# Patient Record
Sex: Female | Born: 1985 | Race: Black or African American | Hispanic: No | Marital: Single | Smoking: Current every day smoker
Health system: Southern US, Community
[De-identification: ages and names within clinical notes are randomized; demographics above are authoritative.]

## PROBLEM LIST (undated history)

## (undated) DIAGNOSIS — I10 Essential (primary) hypertension: Secondary | ICD-10-CM

---
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
## 2004-10-27 ENCOUNTER — Emergency Department (HOSPITAL_COMMUNITY): Admission: EM | Admit: 2004-10-27 | Discharge: 2004-10-27 | Payer: Self-pay | Admitting: Emergency Medicine

## 2008-05-23 ENCOUNTER — Emergency Department (HOSPITAL_COMMUNITY): Admission: EM | Admit: 2008-05-23 | Discharge: 2008-05-23 | Payer: Self-pay | Admitting: *Deleted

## 2008-08-13 ENCOUNTER — Emergency Department (HOSPITAL_COMMUNITY): Admission: EM | Admit: 2008-08-13 | Discharge: 2008-08-13 | Payer: Self-pay | Admitting: Emergency Medicine

## 2009-03-24 ENCOUNTER — Emergency Department (HOSPITAL_COMMUNITY): Admission: EM | Admit: 2009-03-24 | Discharge: 2009-03-24 | Payer: Self-pay | Admitting: Emergency Medicine

## 2009-07-10 DIAGNOSIS — R87629 Unspecified abnormal cytological findings in specimens from vagina: Secondary | ICD-10-CM

## 2009-07-10 HISTORY — DX: Unspecified abnormal cytological findings in specimens from vagina: R87.629

## 2009-10-15 ENCOUNTER — Emergency Department (HOSPITAL_COMMUNITY): Admission: EM | Admit: 2009-10-15 | Discharge: 2009-10-15 | Payer: Self-pay | Admitting: Emergency Medicine

## 2010-09-28 LAB — URINALYSIS, ROUTINE W REFLEX MICROSCOPIC
Protein, ur: NEGATIVE mg/dL
Urobilinogen, UA: 1 mg/dL (ref 0.0–1.0)

## 2010-09-28 LAB — URINE MICROSCOPIC-ADD ON

## 2011-10-13 ENCOUNTER — Encounter (HOSPITAL_COMMUNITY): Payer: Self-pay

## 2011-10-13 ENCOUNTER — Emergency Department (HOSPITAL_COMMUNITY)
Admission: EM | Admit: 2011-10-13 | Discharge: 2011-10-13 | Disposition: A | Discharge status: Home / Self Care | Payer: No Typology Code available for payment source | Attending: Emergency Medicine | Admitting: Emergency Medicine

## 2011-10-13 ENCOUNTER — Emergency Department (HOSPITAL_COMMUNITY): Payer: No Typology Code available for payment source

## 2011-10-13 DIAGNOSIS — S139XXA Sprain of joints and ligaments of unspecified parts of neck, initial encounter: Secondary | ICD-10-CM | POA: Insufficient documentation

## 2011-10-13 DIAGNOSIS — M542 Cervicalgia: Secondary | ICD-10-CM | POA: Insufficient documentation

## 2011-10-13 DIAGNOSIS — Y92410 Unspecified street and highway as the place of occurrence of the external cause: Secondary | ICD-10-CM | POA: Insufficient documentation

## 2011-10-13 DIAGNOSIS — S161XXA Strain of muscle, fascia and tendon at neck level, initial encounter: Secondary | ICD-10-CM

## 2011-10-13 MED ORDER — OXYCODONE-ACETAMINOPHEN 5-325 MG PO TABS: 1.0000 | ORAL_TABLET | ORAL | Status: AC | PRN

## 2011-10-13 MED ORDER — IBUPROFEN 800 MG PO TABS
800.0000 mg | ORAL_TABLET | Freq: Once | ORAL | Status: AC
  Administered 2011-10-13: 800 mg via ORAL
  Filled 2011-10-13: qty 1

## 2011-10-13 MED ORDER — OXYCODONE-ACETAMINOPHEN 5-325 MG PO TABS
1.0000 | ORAL_TABLET | Freq: Once | ORAL | Status: AC
  Administered 2011-10-13: 1 via ORAL
  Filled 2011-10-13: qty 1
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
## 2011-10-13 NOTE — ED Notes (Signed)
Patient transported to X-ray 
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
## 2014-12-30 ENCOUNTER — Emergency Department (HOSPITAL_COMMUNITY): Payer: Self-pay

## 2014-12-30 ENCOUNTER — Emergency Department (HOSPITAL_COMMUNITY)
Admission: EM | Admit: 2014-12-30 | Discharge: 2014-12-30 | Disposition: A | Discharge status: Home / Self Care | Payer: Self-pay | Attending: Emergency Medicine | Admitting: Emergency Medicine

## 2014-12-30 ENCOUNTER — Encounter (HOSPITAL_COMMUNITY): Payer: Self-pay | Admitting: Emergency Medicine

## 2014-12-30 DIAGNOSIS — R202 Paresthesia of skin: Secondary | ICD-10-CM

## 2014-12-30 DIAGNOSIS — R42 Dizziness and giddiness: Secondary | ICD-10-CM | POA: Insufficient documentation

## 2014-12-30 DIAGNOSIS — R200 Anesthesia of skin: Secondary | ICD-10-CM

## 2014-12-30 DIAGNOSIS — G43909 Migraine, unspecified, not intractable, without status migrainosus: Secondary | ICD-10-CM | POA: Insufficient documentation

## 2014-12-30 DIAGNOSIS — I10 Essential (primary) hypertension: Secondary | ICD-10-CM

## 2014-12-30 HISTORY — DX: Essential (primary) hypertension: I10

## 2014-12-30 LAB — DIFFERENTIAL
BASOS ABS: 0.1 10*3/uL (ref 0.0–0.1)
BASOS PCT: 0 % (ref 0–1)
Eosinophils Absolute: 0.1 10*3/uL (ref 0.0–0.7)
Eosinophils Relative: 1 % (ref 0–5)
Lymphocytes Relative: 44 % (ref 12–46)
Lymphs Abs: 5.3 10*3/uL — ABNORMAL HIGH (ref 0.7–4.0)
MONOS PCT: 8 % (ref 3–12)
Monocytes Absolute: 1 10*3/uL (ref 0.1–1.0)
NEUTROS ABS: 5.5 10*3/uL (ref 1.7–7.7)
NEUTROS PCT: 47 % (ref 43–77)

## 2014-12-30 LAB — PROTIME-INR
INR: 1.02 (ref 0.00–1.49)
PROTHROMBIN TIME: 13.6 s (ref 11.6–15.2)

## 2014-12-30 LAB — COMPREHENSIVE METABOLIC PANEL
ALK PHOS: 52 U/L (ref 38–126)
ALT: 29 U/L (ref 14–54)
ANION GAP: 7 (ref 5–15)
AST: 20 U/L (ref 15–41)
Albumin: 4 g/dL (ref 3.5–5.0)
BILIRUBIN TOTAL: 0.9 mg/dL (ref 0.3–1.2)
BUN: 14 mg/dL (ref 6–20)
CO2: 25 mmol/L (ref 22–32)
CREATININE: 0.72 mg/dL (ref 0.44–1.00)
Calcium: 9.1 mg/dL (ref 8.9–10.3)
Chloride: 103 mmol/L (ref 101–111)
Glucose, Bld: 95 mg/dL (ref 65–99)
POTASSIUM: 3.7 mmol/L (ref 3.5–5.1)
SODIUM: 135 mmol/L (ref 135–145)
Total Protein: 7.2 g/dL (ref 6.5–8.1)

## 2014-12-30 LAB — I-STAT CHEM 8, ED
BUN: 13 mg/dL (ref 6–20)
Calcium, Ion: 1.18 mmol/L (ref 1.12–1.23)
Chloride: 103 mmol/L (ref 101–111)
Creatinine, Ser: 0.8 mg/dL (ref 0.44–1.00)
Glucose, Bld: 90 mg/dL (ref 65–99)
HEMATOCRIT: 41 % (ref 36.0–46.0)
HEMOGLOBIN: 13.9 g/dL (ref 12.0–15.0)
POTASSIUM: 3.7 mmol/L (ref 3.5–5.1)
SODIUM: 136 mmol/L (ref 135–145)
TCO2: 22 mmol/L (ref 0–100)

## 2014-12-30 LAB — CBC
HEMATOCRIT: 38.9 % (ref 36.0–46.0)
HEMOGLOBIN: 13.2 g/dL (ref 12.0–15.0)
MCH: 29.8 pg (ref 26.0–34.0)
MCHC: 33.9 g/dL (ref 30.0–36.0)
MCV: 87.8 fL (ref 78.0–100.0)
Platelets: 267 10*3/uL (ref 150–400)
RBC: 4.43 MIL/uL (ref 3.87–5.11)
RDW: 14 % (ref 11.5–15.5)
WBC: 11.9 10*3/uL — ABNORMAL HIGH (ref 4.0–10.5)

## 2014-12-30 LAB — I-STAT TROPONIN, ED: Troponin i, poc: 0 ng/mL (ref 0.00–0.08)

## 2014-12-30 LAB — APTT: APTT: 33 s (ref 24–37)

## 2014-12-30 LAB — CBG MONITORING, ED: GLUCOSE-CAPILLARY: 91 mg/dL (ref 65–99)

## 2014-12-30 MED ORDER — LISINOPRIL 10 MG PO TABS: 10.0000 mg | ORAL_TABLET | Freq: Every day | ORAL | Status: DC

## 2014-12-30 NOTE — ED Notes (Signed)
Patient transported to CT 

## 2014-12-30 NOTE — ED Notes (Signed)
Social worker at bedside.

## 2014-12-30 NOTE — ED Provider Notes (Addendum)
CSN: [id number]     Arrival date & time 12/30/14  1936 History   First MD Initiated Contact with Patient 12/30/14 2020     Chief Complaint  Patient presents with  . Numbness  . Hypertension     (Consider location/radiation/quality/duration/timing/severity/associated sxs/prior Treatment) HPI  29 year old female presents after having a more severe than typical migraine headache earlier in the day. When she was in between flights she noticed that she had a gradually worsening right-sided migraine headache. Location and type of pain is similar to multiple prior headaches that she gets almost daily but was a little more severe today. Also was associated with dizziness. She chronically has intermittent left fingertip numbness and left calf to foot numbness. He seemed to come and go. These were not particularly worse than normal. She has also chronically been hypertensive, frequently her sister who is a nurse checks and she is 160 systolic over 80. This is been ongoing for months and she's been trying diet and exercise to help with the symptoms. Has not been on medicines at any point. Denies chest pain, shortness of breath, or blurry vision. Headache, dizziness, and numbness of all resolved prior to me seeing the patient.  Past Medical History  Diagnosis Date  . Hypertension    History reviewed. No pertinent past surgical history. History reviewed. No pertinent family history. History  Substance Use Topics  . Smoking status: Not on file  . Smokeless tobacco: Not on file  . Alcohol Use: Not on file   OB History    No data available     Review of Systems  Constitutional: Negative for fever.  Eyes: Negative for visual disturbance.  Respiratory: Negative for shortness of breath.   Cardiovascular: Negative for chest pain.  Gastrointestinal: Negative for vomiting.  Neurological: Positive for dizziness, numbness and headaches. Negative for weakness.  All other systems reviewed and are  negative.     Allergies  Review of patient's allergies indicates no known allergies.  Home Medications   Prior to Admission medications   Medication Sig Start Date End Date Taking? Authorizing Provider  acetaminophen (TYLENOL) 325 MG tablet Take 650 mg by mouth every 6 (six) hours as needed for moderate pain (back pain). For pain   Yes Historical Provider, MD  ASA-APAP-Salicyl-Caff (PAIN RELIEF) TABS Take 1 tablet by mouth every 6 (six) hours as needed (back pain).   Yes Historical Provider, MD  lisinopril (PRINIVIL,ZESTRIL) 10 MG tablet Take 1 tablet (10 mg total) by mouth daily. 12/30/14   Pricilla Loveless, MD   BP 157/88 mmHg  Pulse 62  Temp(Src) 98.3 F (36.8 C) (Oral)  Resp 21  SpO2 100%  LMP 12/09/2014 Physical Exam  Constitutional: She is oriented to person, place, and time. She appears well-developed and well-nourished.  HENT:  Head: Normocephalic and atraumatic.  Right Ear: External ear normal.  Left Ear: External ear normal.  Nose: Nose normal.  Eyes: EOM are normal. Pupils are equal, round, and reactive to light. Right eye exhibits no discharge. Left eye exhibits no discharge.  Neck: Neck supple.  Cardiovascular: Normal rate, regular rhythm and normal heart sounds.   Pulmonary/Chest: Effort normal and breath sounds normal.  Abdominal: Soft. She exhibits no distension. There is no tenderness.  Neurological: She is alert and oriented to person, place, and time.  CN 2-12 grossly intact. 5/5 strength in all 4 extremities. Normal gross sensation to LLE, Left fingers has subjective decreased sensation. Normal finger to nose  Skin: Skin is warm and dry.  Nursing note and vitals reviewed.   ED Course  Procedures (including critical care time) Labs Review Labs Reviewed  CBC - Abnormal; Notable for the following:    WBC 11.9 (*)    All other components within normal limits  DIFFERENTIAL - Abnormal; Notable for the following:    Lymphs Abs 5.3 (*)    All other  components within normal limits  PROTIME-INR  APTT  COMPREHENSIVE METABOLIC PANEL  I-STAT CHEM 8, ED  I-STAT TROPOININ, ED  CBG MONITORING, ED    Imaging Review Ct Head (brain) Wo Contrast  12/30/2014   CLINICAL DATA:  High blood pressure , frontal headache  EXAM: CT HEAD WITHOUT CONTRAST  TECHNIQUE: Contiguous axial images were obtained from the base of the skull through the vertex without intravenous contrast.  COMPARISON:  None.  FINDINGS: No skull fracture is noted.  No intracranial hemorrhage, mass effect or midline shift.  No acute cortical infarction. No mass lesion is noted on this unenhanced scan. No hydrocephalus. The gray and white-matter differentiation is preserved.  Paranasal sinuses and mastoid air cells are unremarkable.  IMPRESSION: No acute intracranial abnormality.   Electronically Signed   By: Natasha Mead M.D.   On: 12/30/2014 20:41     EKG Interpretation   Date/Time:  Wednesday December 30 2014 19:47:23 EDT Ventricular Rate:  60 PR Interval:  128 QRS Duration: 101 QT Interval:  407 QTC Calculation: 407 R Axis:   20 Text Interpretation:  Sinus rhythm Baseline wander in lead(s) V1 V3 Normal  ECG No old tracing to compare Confirmed by Dandrea Medders  MD, Jaquin Coy ([id number]) on  12/30/2014 8:24:53 PM      MDM   Final diagnoses:  Essential hypertension  Numbness and tingling in left hand  Numbness in left leg    Patient's numbness is not an acute issue. Her migraine headache is already resolved, could be migraines or could be related to her chronic hypertension. At this point she is hypertensive but has no other symptoms except vague numbness to her fingertips. Given her long-standing history of hypertension, I will start her on lisinopril as she is already bradycardic in the ER. I counseled her on needing to stop this if she was to become pregnant. Discussed the importance of following up with her PCP. No signs of a stroke at this time.    Pricilla Loveless, MD 12/30/14  2125  Pricilla Loveless, MD 12/30/14 2130

## 2014-12-30 NOTE — Progress Notes (Signed)
EDCM spoke to patient at bedside. Patient confirms she does not have a pcp or insurance living in [place].  EDCM provided patient with pamphlet to [facility], informed patient of services there and walk in times.  EDCM also provided patient with list of pcps who accept self pay patients, list of discount pharmacies and websites needymeds.org and GoodRX.com for medication assistance, phone number to inquire about the orange card, phone number to inquire about Mediciad, phone number to inquire about the Affordable Care Act, financial resources in the community such as local churches, salvation army, urban ministries, and dental assistance for uninsured patients.  Patient thankful for resources.  No further EDCM needs at this time. 

## 2014-12-30 NOTE — ED Notes (Signed)
Pt states she just got in town from a flight this afternoon. States she started feeling dizzy around 12:30 this afternoon. When she got on her flight this afternoon she began feeling some left arm and leg numbness and tingling. States she also began feeling a severe migraine, which she has often. When she got to her younger sister's house after the flight, she took her blood pressure and found it to be 180/86. Hx of high blood pressure, states she does not take medication for it.

## 2014-12-30 NOTE — ED Notes (Signed)
MD at bedside. 

## 2014-12-30 NOTE — Discharge Instructions (Signed)
°Emergency Department Resource Guide °1) Find a Doctor and Pay Out of Pocket °Although you won't have to find out who is covered by your insurance plan, it is a good idea to ask around and get recommendations. You will then need to call the office and see if the doctor you have chosen will accept you as a new patient and what types of options they offer for patients who are self-pay. Some doctors offer discounts or will set up payment plans for their patients who do not have insurance, but you will need to ask so you aren't surprised when you get to your appointment. ° °2) Contact Your Local Health Department °Not all health departments have doctors that can see patients for sick visits, but many do, so it is worth a call to see if yours does. If you don't know where your local health department is, you can check in your phone book. The CDC also has a tool to help you locate your state's health department, and many state websites also have listings of all of their local health departments. ° °3) Find a Walk-in Clinic °If your illness is not likely to be very severe or complicated, you may want to try a walk in clinic. These are popping up all over the country in pharmacies, drugstores, and shopping centers. They're usually staffed by nurse practitioners or physician assistants that have been trained to treat common illnesses and complaints. They're usually fairly quick and inexpensive. However, if you have serious medical issues or chronic medical problems, these are probably not your best option. ° °No Primary Care Doctor: °- Call [facility] at  832-8000 - they can help you locate a primary care doctor that  accepts your insurance, provides certain services, etc. °- Physician Referral Service- 1-800-533-3463 ° °Chronic Pain Problems: °Organization         Address  Phone   Notes  °[facility]  (336) 297-2271 Patients need to be referred by their primary care doctor.  ° °Medication  Assistance: °Organization         Address  Phone   Notes  °[place] [facility] [address] °[place], [place] [address] (336) 641-8030 --Must be a resident of [place] °-- Must have NO insurance coverage whatsoever (no Medicaid/ Medicare, etc.) °-- The pt. MUST have a primary care doctor that directs their care regularly and follows them in the community °  °[facility]  (866) 331-1348   °[employer]  (888) 892-1162   ° °Agencies that provide inexpensive medical care: °Organization         Address  Phone   Notes  °[facility]  (336) 832-8035   °[facility]    (336) 832-7272   °[facility] [address] °[place], [place] [address] (336) 832-4777   °[facility] [address], °[place] (336) 271-4999   °[facility]    (336) 373-0678   °[facility]    (336) 272-1050   °[facility] ° [address], [place] Phone:  (336) 832-4444, Fax:  (336) 832-4440 Hours of Operation:  9 am - 6 pm, M-F.  Also accepts Medicaid/Medicare and self-pay.  °[facility] ° [address], [place] Phone: (336) 832-3150, Fax: (336) [phone]. Hours of Operation:  8:30 am - 5:30 pm, M-F.  Also accepts Medicaid and self-pay.  °[facility] [place] [address], [place] Phone: (336) 878-6027   °[facility] [address], [place], [place] (336)723-1848, Ext. 123 Mondays & Thursdays: 7-9 AM.  First 15 patients are seen on a first come, first serve basis. °  ° °Medicaid-accepting [place] Providers: ° °Organization         Address  Phone   Notes  °[facility] [address], [place] (336) 641-2100 Also accepts self-pay patients.  °[facility] [address], [place] ° (336) 856-9996   °[facility] [address], [place]  (336) 288-8857   °[facility] [address] [place] [address], [place] (336) 299-7000   °Veita Bland [address], [place]  ° (336) 373-1557 Only accepts [place] Access Medicaid patients after they have their name applied to their card.  ° °Self-Pay (no insurance) in [place]: ° °Organization         Address  Phone   Notes  °Sickle Cell Patients, [facility] [address], [place] (336) 832-1970   °[facility] [address], [place] (336) 832-4400   °[facility] ° [address], [place] (336) 992-4800   °[facility]/Dr. Osei-Bonsu ° [address] [place] [address], [place] or [address], [place] (336) [phone] Phone number for both [place] and [place] locations is the same.  °[facility] [address], [place] (336) 299-0000   °[facility] [address] [place] [address], [place] or [address] (336) 852-7530 °(336) 878-2260   °[facility] [address], [place] (336) 350-1642, phone; (336) 294-5005, fax Sees patients 1st and 3rd Saturday of every month.  Must not qualify for public or private insurance (i.e. Medicaid, Medicare, [place] Health Choice, Veterans' Benefits) • Household income should be no more than 200% of the poverty level •The clinic cannot treat you if you are pregnant or think you are pregnant • Sexually transmitted diseases are not treated at the clinic.  ° ° °Dental Care: °Organization         Address  Phone  Notes  °[place] [facility] [address], [place] (336) 641-6152 Accepts children up to age 21 who are enrolled in Medicaid or [place] Health Choice; pregnant women with a Medicaid card; and children who have applied for Medicaid or [place] Health Choice, but were declined, whose parents can pay a reduced fee at time of service.  °[place]  [facility] [place]  [address], [place] (336) 641-7733 Accepts children up to age 21 who are enrolled in Medicaid or [place] Health Choice; pregnant women with a Medicaid card; and children who have applied for Medicaid or [place] Health Choice, but were declined, whose parents can pay a reduced fee at time of service.  °[facility] ° [address],  (336) 641-4533 Patients are seen by appointment only. Walk-ins are not accepted. [facility] will see patients 18 years of age and older. °Monday - Tuesday (8am-5pm) °Most Wednesdays (8:30-5pm) °$30 per visit, cash only  °[facility] ° [address], [place] (336) 641-4533 Patients are seen by appointment only. Walk-ins are not accepted. [facility] will see patients 18 years of age and older. °One   Wednesday Evening (Monthly: Volunteer Based).  $30 per visit, cash only  °[facility]  (919) 537-3737 for adults; Children under age 4, call Graduate Pediatric Dentistry at (919) [phone]. Children aged 4-14, please call (919) [phone] to request a pediatric application. ° Dental services are provided in all areas of dental care including fillings, crowns and bridges, complete and partial dentures, implants, gum treatment, root canals, and extractions. Preventive care is also provided. Treatment is provided to both adults and children. °Patients are selected via a lottery and there is often a waiting list. °  °[facility] [address], °[place] ° (336) 763-8833 [email or web address] °  °[facility] [address], [place], [place] (336)723-1848, Ext. 123 Second and Fourth Thursday of each month, opens at 6:30 AM; Clinic ends at 9 AM.  Patients are seen on a first-come first-served basis, and a limited number are seen during each clinic.  ° °[facility] ° [address], [place], [place] (336) 723-7904    Eligibility Requirements °You must have lived in [place], [place], or [place] counties for at least the last three months. °  You cannot be eligible for state or federal sponsored healthcare insurance, including Veterans Administration, Medicaid, or Medicare. °  You generally cannot be eligible for healthcare insurance through your employer.  °  How to apply: °Eligibility screenings are held every Tuesday and Wednesday afternoon from 1:00 pm until 4:00 pm. You do not need an appointment for the interview!  °[facility] [address], [place], [place] 336-631-2330   °[place] [facility]  336-342-8273   °[facility]  336-703-3100   °[facility]  336-570-6415   ° °Behavioral Health Resources in the Community: °Intensive Outpatient Programs °Organization         Address  Phone  Notes  °[facility] [address], [place], [place] 336-878-6098   °[facility] [address], [place], [place] 336-832-9800   °[facility] [address], [place], [place] ° 336-882-2125   °[facility] [address],  °[place], [place] 1-800-853-5163 or 336-641-4981   °Substance Abuse Resources °Organization         Address  Phone  Notes  °[facility]  336-882-2125   °[facility]  336-784-9470   °[facility]  336-285-9073   °[facility]  336-845-3988   °Residential & Outpatient Substance Abuse Program  1-800-659-3381   °Psychological Services °Organization         Address  Phone  Notes  °[facility]  336- 832-9600   °[facility]  336- 378-7881   °[facility] [address], [place] 1-800-853-5163 or 336-641-4981   ° °Mobile Crisis Teams °Organization         Address  Phone  Notes  °[facility], Mobile Crisis Care Unit  1-877-626-1772   °Assertive °[facility] ° [address]  [place], [place] 336-834-9664   °Sharon DeEsch [address] °[place] 336-554-5454   ° °Self-Help/Support Groups °Organization         Address  Phone             Notes  °[facility] - variety of support groups  336- 373-1402 Call for more information  °Narcotics Anonymous (NA), [facility] [address], °[place]  2 meetings at this location  ° °  Residential Treatment Programs °Organization         Address  Phone  Notes  °[facility] [address],    °[place]  1-866-801-8205   °[facility] ° [address], [place], [place] 704-293-8524   °[facility] [address], [place] 336-845-3988 Admissions: 8am-3pm M-F  °[facility] [address]    °[place], [place] 336-841-1104   °[facility] [address], [place], [place] 336-379-7146   °[facility] [address]  ° [place], [place] 336-285-9073   °[facility] Intensive Outpatient [address], [place], [place] 336-852-3033   °[facility] [address]  °[place], [place] 1-877-615-2722 or 336-784-9470   °[facility] [address] [place], [place] 336-227-7417 Accepts Medicaid  °[facility] [address]  ° [place] 1-800-659-3381 Substance Abuse/Addiction Treatment  ° °[place] Behavioral Health Resources °Organization         Address  Phone  Notes  °[facility]  (888) 581-9988   °Julie Brannon, PhD [address] [place], [place]   (336) 349-5553 or (336) 951-0000   °[facility]   [address] °[place], [place] (336) 349-4454   °[facility] [address], [place], [place] (336) 342-8316 Insurance/Medicaid/sponsorship through [facility]  °[facility] [address]                                    [place], [place] (336) 342-8316 Therapy/tele-psych/case    °[facility] [address]  ° [place], [place] (336) 349-2233    °Dr. Arfeen  (336) 349-4544   °[facility] [place]  [employer] [place] [facility] 1) [address], [place] °2) [address], [place] °3)  [address], [place] (336) 349-3220 °(336) 342-7768 ° °(336) 342-8140   °[place] [facility] (336) 342-1394 or (336) 342-3537 (After Hours)    ° ° °

## 2014-12-30 NOTE — ED Notes (Addendum)
Pt reports hx of elevated BP for the last year.  Pt also states she has been getting headaches "every other night" x 74yr.  Pt also c/o left hand and left leg tingling and numbness intermittently x 73yr.  Pt states her symptoms today are not new.  Pt states she does not take medication for hypertension.

## 2018-07-10 HISTORY — PX: MOUTH SURGERY: SHX715

## 2021-01-11 ENCOUNTER — Ambulatory Visit (INDEPENDENT_AMBULATORY_CARE_PROVIDER_SITE_OTHER): Payer: Self-pay | Admitting: Family Medicine

## 2021-01-11 ENCOUNTER — Other Ambulatory Visit: Payer: Self-pay

## 2021-01-11 ENCOUNTER — Encounter: Payer: Self-pay | Admitting: Family Medicine

## 2021-01-11 VITALS — BP 140/76 | HR 70 | Wt 231.1 lb

## 2021-01-11 DIAGNOSIS — O10919 Unspecified pre-existing hypertension complicating pregnancy, unspecified trimester: Secondary | ICD-10-CM

## 2021-01-11 DIAGNOSIS — O0990 Supervision of high risk pregnancy, unspecified, unspecified trimester: Secondary | ICD-10-CM

## 2021-01-11 DIAGNOSIS — Z3A11 11 weeks gestation of pregnancy: Secondary | ICD-10-CM

## 2021-01-11 MED ORDER — NIFEDIPINE ER OSMOTIC RELEASE 30 MG PO TB24: 30.0000 mg | ORAL_TABLET | Freq: Every day | ORAL | 5 refills | Status: DC

## 2021-01-11 NOTE — Progress Notes (Signed)
   Subjective:  Suzanne Lambert is a 35 y.o. G2P0010 at [redacted]w[redacted]d being seen today for ongoing prenatal care.  She is currently monitored for the following issues for this high-risk pregnancy and has Supervision of high risk pregnancy, antepartum and Chronic hypertension affecting pregnancy on their problem list.  Patient reports  vaginal bleeding that was heavy last night, light today. Reports period was very regular prior to conceiving .   . Vag. Bleeding: Bloody Show.  Movement: Absent. Denies leaking of fluid.   The following portions of the patient's history were reviewed and updated as appropriate: allergies, current medications, past family history, past medical history, past social history, past surgical history and problem list. Problem list updated.  Objective:   Vitals:   01/11/21 1044  BP: 140/76  Pulse: 70  Weight: 231 lb 1.6 oz (104.8 kg)    Fetal Status:     Movement: Absent     General:  Alert, oriented and cooperative. Patient is in no acute distress.  Skin: Skin is warm and dry. No rash noted.   Cardiovascular: Normal heart rate noted  Respiratory: Normal respiratory effort, no problems with respiration noted  Abdomen: Soft, gravid, appropriate for gestational age. Pain/Pressure: Present     Pelvic: Vag. Bleeding: Bloody Show     Cervical exam deferred        Extremities: Normal range of motion.     Mental Status: Normal mood and affect. Normal behavior. Normal judgment and thought content.   Urinalysis:      Assessment and Plan:  Pregnancy: G2P0010 at [redacted]w[redacted]d  1. Supervision of high risk pregnancy, antepartum BP mildly elevated, see below Bedside US performed and showed viable IUP with normal somatic and cardiac activity FHR 171 by M-mode Discussed that vaginal bleeding in the first trimester is common, and 80-90% that have an IUP with normal cardiac activity go on to have a normal pregnancy Accidentally labeled as ROB instead of new OB, will reschedule for intake  and new OB appt  2. Chronic hypertension affecting pregnancy Mildly elevated, discussed CHAP trial and rationale for tighter control of BP Currently on labetalol 100mg  BID but endorses sometimes missing doses Transitioned to Nifedipine - NIFEdipine (PROCARDIA-XL/NIFEDICAL-XL) 30 MG 24 hr tablet; Take 1 tablet (30 mg total) by mouth daily.  Dispense: 30 tablet; Refill: 5  Preterm labor symptoms and general obstetric precautions including but not limited to vaginal bleeding, contractions, leaking of fluid and fetal movement were reviewed in detail with the patient. Please refer to After Visit Summary for other counseling recommendations.  Return in about 1 week (around 01/18/2021).   03/21/2021, MD

## 2021-01-17 ENCOUNTER — Other Ambulatory Visit: Payer: Self-pay

## 2021-01-17 ENCOUNTER — Telehealth (INDEPENDENT_AMBULATORY_CARE_PROVIDER_SITE_OTHER): Payer: Self-pay | Admitting: *Deleted

## 2021-01-17 VITALS — Ht 68.0 in

## 2021-01-17 DIAGNOSIS — Z3A00 Weeks of gestation of pregnancy not specified: Secondary | ICD-10-CM

## 2021-01-17 DIAGNOSIS — O10919 Unspecified pre-existing hypertension complicating pregnancy, unspecified trimester: Secondary | ICD-10-CM

## 2021-01-17 DIAGNOSIS — E669 Obesity, unspecified: Secondary | ICD-10-CM

## 2021-01-17 DIAGNOSIS — O09529 Supervision of elderly multigravida, unspecified trimester: Secondary | ICD-10-CM

## 2021-01-17 DIAGNOSIS — O0990 Supervision of high risk pregnancy, unspecified, unspecified trimester: Secondary | ICD-10-CM

## 2021-01-17 DIAGNOSIS — O99210 Obesity complicating pregnancy, unspecified trimester: Secondary | ICD-10-CM

## 2021-01-17 MED ORDER — PRENATAL 27-0.8 MG PO TABS: 1.0000 | ORAL_TABLET | Freq: Every day | ORAL | 12 refills | Status: DC

## 2021-01-17 NOTE — Progress Notes (Signed)
New OB Intake  I connected with  Suzanne Lambert on 01/17/21 at  9:15 AM EDT by MyChart Video Visit and verified that I am speaking with the correct person using two identifiers. Nurse is located at [facility] and pt is located at home.  I discussed the limitations, risks, security and privacy concerns of performing an evaluation and management service by telephone and the availability of in person appointments. I also discussed with the patient that there may be a patient responsible charge related to this service. The patient expressed understanding and agreed to proceed.  I explained I am completing New OB Intake today. We discussed her EDD of 07/30/21 that is based on LMP of 10/23/20. Pt is G2/P0010. I reviewed her allergies, medications, Medical/Surgical/OB history, and appropriate screenings. I informed her of [facility] services. Based on history, this is a/an  pregnancy complicated by [facility], AMA, Obesity  .   Patient Active Problem List   Diagnosis Date Noted   Supervision of high risk pregnancy, antepartum 01/11/2021   Chronic hypertension affecting pregnancy 01/11/2021    Concerns addressed today  Delivery Plans:  Plans to deliver at [facility].   MyChart/Babyscripts MyChart access verified. I explained pt will have some visits in office and some virtually. Babyscripts instructions given and order placed. Patient verifies receipt of registration text/e-mail.   Blood Pressure Cuff  Patient is applying for pregnancy Medicaid. Explained we can order bp cuff once medicaid effective.  Explained after first prenatal appt pt will check weekly and document in Babyscripts.  Weight scale: Patient does  not  have weight scale. Weight scale can be ordered once medicaid effective.   Anatomy US Explained first scheduled [place] will be around 19 weeks. Anatomy US will be scheduled and patient notified by Mychart.   Labs Discussed Avelina Laine genetic screening with patient. Would like both Panorama and Horizon  drawn at new OB visit. Routine prenatal labs needed.  Covid Vaccine Patient has not Had the covid vaccine.   Mother/ Baby Dyad Candidate?   No.  If yes, offer as possibility  Informed patient of [facility] and placed .phrase  In her AVS .  Social Determinants of Health Food Insecurity: Patient expresses food insecurity. Food Market information given to patient; explained patient may visit at the end of first OB appointment. WIC Referral: Patient is not interested in referral to [facility]. She already has [facility] appointment this week.  Transportation: Patient expressed transportation needs. Transportation Services reviewed with patient; patient registered and phone number provided for patient to schedule rides. Childcare: Discussed no children allowed at ultrasound appointments. Offered childcare services; patient declines childcare services at this time.  First visit review I reviewed new OB appt with pt. I explained she will have a pelvic exam, ob bloodwork with genetic screening, and PAP smear. Explained pt will be seen by Luna Kitchens ,CNM  at first visit; and then by MD's at later appointments due to being high risk. encounter routed to appropriate provider. Explained that patient will be seen by pregnancy navigator following visit with provider. [facility] information placed in AVS.   Jammi Morrissette,RN 01/17/2021  9:24 AM

## 2021-01-17 NOTE — Patient Instructions (Addendum)
  At our [facility] Practices, we work as an integrated team, providing care to address both physical and emotional health. Your medical provider may refer you to see our Behavioral Health Clinician [facility]) on the same day you see your medical provider, as availability permits; often scheduled virtually at your convenience.  Our [facility] is available to all patients, visits generally last between 20-30 minutes, but can be longer or shorter, depending on patient need. The [facility] offers help with stress management, coping with symptoms of depression and anxiety, major life changes , sleep issues, changing risky behavior, grief and loss, life stress, working on personal life goals, and  behavioral health issues, as these all affect your overall health and wellness.  The [facility] is NOT available for the following: FMLA paperwork, court-ordered evaluations, specialty assessments (custody or disability), letters to employers, or obtaining certification for an emotional support animal. The [facility] does not provide long-term therapy. You have the right to refuse integrated behavioral health services, or to reschedule to see the [facility] at a later date.  Confidentiality exception: If it is suspected that a child or disabled adult is being abused or neglected, we are required by law to report that to either Child Protective Services or Adult Management consultant.  If you have a diagnosis of Bipolar affective disorder, Schizophrenia, or recurrent Major depressive disorder, we will recommend that you establish care with a psychiatrist, as these are lifelong, chronic conditions, and we want your overall emotional health and medications to be more closely monitored. If you anticipate needing extended maternity leave due to mental health issues postpartum, it it recommended you inform your medical provider, so we can put in a referral to a psychiatrist as soon as possible. The [facility] is unable to recommend an extended maternity leave for mental  health issues. Your medical provider or [facility] may refer you to a therapist for ongoing, traditional therapy, or to a psychiatrist, for medication management, if it would benefit your overall health. Depending on your insurance, you may have a copay or be charged a deductible, depending on your insurance, to see the [facility]. If you are uninsured, it is recommended that you apply for financial assistance. (Forms may be requested at the front desk for in-person visits, via MyChart, or request a form during a virtual visit).  If you see the [facility] more than 6 times, you will have to complete a comprehensive clinical assessment interview with the [facility] to resume integrated services.  For virtual visits with the [facility], you must be physically in the state of [place] at the time of the visit. For example, if you live in [place], you will have to do an in-person visit with the [facility], and your out-of-state insurance may not cover behavioral health services in [place]. If you are going out of the state or country for any reason, the [facility] may see you virtually when you return to [place], but not while you are physically outside of [place].    [email or web address] is a great resource   If you are in need of transportation to get to and from your appointments in our office.  You can reach Transportation Services by calling [phone] Monday - Friday  7am-6pm.

## 2021-01-17 NOTE — Addendum Note (Signed)
Addended by: Gerome Apley on: 01/17/2021 03:29 PM   Modules accepted: Orders

## 2021-01-18 ENCOUNTER — Other Ambulatory Visit (HOSPITAL_COMMUNITY)
Admission: RE | Admit: 2021-01-18 | Discharge: 2021-01-18 | Disposition: A | Discharge status: Home / Self Care | Payer: Medicaid Other | Source: Ambulatory Visit | Attending: Student | Admitting: Student

## 2021-01-18 ENCOUNTER — Ambulatory Visit (INDEPENDENT_AMBULATORY_CARE_PROVIDER_SITE_OTHER): Payer: Self-pay | Admitting: Student

## 2021-01-18 VITALS — BP 150/90 | HR 80 | Wt 233.1 lb

## 2021-01-18 DIAGNOSIS — Z3A12 12 weeks gestation of pregnancy: Secondary | ICD-10-CM

## 2021-01-18 DIAGNOSIS — O0990 Supervision of high risk pregnancy, unspecified, unspecified trimester: Secondary | ICD-10-CM

## 2021-01-18 DIAGNOSIS — O10919 Unspecified pre-existing hypertension complicating pregnancy, unspecified trimester: Secondary | ICD-10-CM

## 2021-01-18 MED ORDER — NIFEDIPINE ER OSMOTIC RELEASE 60 MG PO TB24: 60.0000 mg | ORAL_TABLET | Freq: Every day | ORAL | 1 refills | Status: DC

## 2021-01-18 NOTE — Progress Notes (Signed)
Patient complains of "period like cramps" on the left side of pelvic along with vaginal spotting last night. Patient stated that she hasn't had any spotting this morning

## 2021-01-18 NOTE — Progress Notes (Addendum)
Subjective:    Suzanne Lambert is being seen today for her first obstetrical visit.  She had a prenatal visit with Dr. Crissie Reese last week, but this is her initial prenatal visit. This  not planned but not prevented pregnancy. She is at [redacted]w[redacted]d gestation. Her obstetrical history is significant for  CHTN, on procardia 30 mg XLand also taking baby ASA. She also takes PNV . Last IC 8 weeks ago; reports some BRB bleeding and then it turned brown.  It has resolved. She is worried about fetal well-being today. Relationship with FOB: significant other, not living together. He lives in [place] because he is a Surveyor, minerals. Patient does intend to breast feed. Pregnancy history fully reviewed.  She states that in the past her BP was 170/? States that it was "it was definitely higher" than it is today (today it is 150/90). . Reports that it was extremely high at the [facility] and was started on latebaltol by an MD there  in June 7.  She then came to her appt on 01/11/2021 and Dr. Crissie Reese switched her to procardia 30 XL.   She takes her procardia at 74 every day; has not taken today.   Patient reports no complaints.  Review of Systems:   Review of Systems  Constitutional: Negative.   HENT: Negative.    Genitourinary:  Positive for vaginal discharge.  Neurological: Negative.   Hematological: Negative.    Objective:     BP (!) 150/90   Pulse 80   Wt 233 lb 1.6 oz (105.7 kg)   LMP 10/23/2020   BMI 35.44 kg/m  Physical Exam Constitutional:      Appearance: Normal appearance.  Cardiovascular:     Rate and Rhythm: Normal rate and regular rhythm.  Pulmonary:     Effort: Pulmonary effort is normal.  Abdominal:     General: Abdomen is flat.     Palpations: Abdomen is soft.  Neurological:     Mental Status: She is alert.   Breast exam benign-no lumps, masses or tenderness.  Exam    Assessment:    Pregnancy: G2P0010 Patient Active Problem List   Diagnosis Date Noted   Obesity in pregnancy 01/17/2021    Supervision of high risk pregnancy, antepartum 01/11/2021   Chronic hypertension affecting pregnancy 01/11/2021       Plan:     Initial labs drawn. Prenatal vitamins. Problem list reviewed and updated. AFP3 discussed: too early. Role of ultrasound in pregnancy discussed; fetal survey: ordered. Amniocentesis discussed: not indicated. Follow up in 4 weeks. 75% of 30  min visit spent on counseling and coordination of care.  -genetic testing today -Pap today; she has temporary medicaid that covers her through the end of JUly  -discussed BP today with Dr. Shawnie Pons, will switch to 60 mg XL of procardia; RX sent -patient to see MD at next visit for HROB -continue taking baby ASA -will draw baseline labs today for [facility] -reviewed that Vag bleeding can be normal in pregnancy at the beginning; if she has pain or bleeding worsening she should go to MAU.  -Pt informed that the ultrasound is considered a limited OB ultrasound and is not intended to be a complete ultrasound exam.  Patient also informed that the ultrasound is not being completed with the intent of assessing for fetal or placental anomalies or any pelvic abnormalities.  Explained that the purpose of today's ultrasound is to assess for  viability.  Patient acknowledges the purpose of the exam and the limitations of  the study.  Cardiac activity and somatic movement noted.   Charlesetta Garibaldi [facility] 01/18/2021

## 2021-01-19 LAB — CBC/D/PLT+RPR+RH+ABO+RUBIGG...
Antibody Screen: NEGATIVE
Basophils Absolute: 0.1 10*3/uL (ref 0.0–0.2)
Basos: 1 %
EOS (ABSOLUTE): 0 10*3/uL (ref 0.0–0.4)
Eos: 0 %
HCV Ab: <0.1 s/co ratio (ref 0.0–0.9)
HIV Screen 4th Generation wRfx: NONREACTIVE
Hematocrit: 34.2 % (ref 34.0–46.6)
Hemoglobin: 10.5 g/dL — ABNORMAL LOW (ref 11.1–15.9)
Hepatitis B Surface Ag: NEGATIVE
Immature Grans (Abs): 0 10*3/uL (ref 0.0–0.1)
Immature Granulocytes: 0 %
Lymphocytes Absolute: 2.9 10*3/uL (ref 0.7–3.1)
Lymphs: 30 %
MCH: 22 pg — ABNORMAL LOW (ref 26.6–33.0)
MCHC: 30.7 g/dL — ABNORMAL LOW (ref 31.5–35.7)
MCV: 72 fL — ABNORMAL LOW (ref 79–97)
Monocytes Absolute: 1.1 10*3/uL — ABNORMAL HIGH (ref 0.1–0.9)
Monocytes: 11 %
Neutrophils Absolute: 5.5 10*3/uL (ref 1.4–7.0)
Neutrophils: 58 %
Platelets: 423 10*3/uL (ref 150–450)
RBC: 4.77 x10E6/uL (ref 3.77–5.28)
RDW: 28 % — ABNORMAL HIGH (ref 11.7–15.4)
RPR Ser Ql: NONREACTIVE
Rh Factor: POSITIVE
Rubella Antibodies, IGG: 3.99 index (ref >0.99)
WBC: 9.6 10*3/uL (ref 3.4–10.8)

## 2021-01-19 LAB — COMPREHENSIVE METABOLIC PANEL
ALT: 17 IU/L (ref 0–32)
AST: 11 IU/L (ref 0–40)
Albumin/Globulin Ratio: 1.8 (ref 1.2–2.2)
Albumin: 4.5 g/dL (ref 3.8–4.8)
Alkaline Phosphatase: 52 IU/L (ref 44–121)
BUN/Creatinine Ratio: 10 (ref 9–23)
BUN: 5 mg/dL — ABNORMAL LOW (ref 6–20)
Bilirubin Total: <0.2 mg/dL (ref 0.0–1.2)
CO2: 16 mmol/L — ABNORMAL LOW (ref 20–29)
Calcium: 10 mg/dL (ref 8.7–10.2)
Chloride: 103 mmol/L (ref 96–106)
Creatinine, Ser: 0.48 mg/dL — ABNORMAL LOW (ref 0.57–1.00)
Globulin, Total: 2.5 g/dL (ref 1.5–4.5)
Glucose: 97 mg/dL (ref 65–99)
Potassium: 4.4 mmol/L (ref 3.5–5.2)
Sodium: 134 mmol/L (ref 134–144)
Total Protein: 7 g/dL (ref 6.0–8.5)
eGFR: 126 mL/min/{1.73_m2} (ref >59)

## 2021-01-19 LAB — HEMOGLOBIN A1C
Est. average glucose Bld gHb Est-mCnc: 105 mg/dL
Hgb A1c MFr Bld: 5.3 % (ref 4.8–5.6)

## 2021-01-19 LAB — HCV INTERPRETATION

## 2021-01-19 LAB — PROTEIN / CREATININE RATIO, URINE
Creatinine, Urine: 211.6 mg/dL
Protein, Ur: 50.1 mg/dL
Protein/Creat Ratio: 237 mg/g creat — ABNORMAL HIGH (ref 0–200)

## 2021-01-20 ENCOUNTER — Other Ambulatory Visit: Payer: Self-pay | Admitting: Student

## 2021-01-20 DIAGNOSIS — B9629 Other Escherichia coli [E. coli] as the cause of diseases classified elsewhere: Secondary | ICD-10-CM | POA: Insufficient documentation

## 2021-01-20 DIAGNOSIS — N390 Urinary tract infection, site not specified: Secondary | ICD-10-CM | POA: Insufficient documentation

## 2021-01-20 LAB — URINE CULTURE, OB REFLEX

## 2021-01-20 LAB — CULTURE, OB URINE

## 2021-01-20 MED ORDER — NITROFURANTOIN MONOHYD MACRO 100 MG PO CAPS: 100.0000 mg | ORAL_CAPSULE | Freq: Two times a day (BID) | ORAL | 0 refills | Status: DC

## 2021-01-24 LAB — CYTOLOGY - PAP
Chlamydia: NEGATIVE
Comment: NEGATIVE
Comment: NEGATIVE
Comment: NORMAL
Diagnosis: NEGATIVE
High risk HPV: NEGATIVE
Neisseria Gonorrhea: NEGATIVE

## 2021-01-25 ENCOUNTER — Encounter: Payer: Self-pay | Admitting: *Deleted

## 2021-01-25 NOTE — Progress Notes (Signed)
Chart reviewed for nurse visit. Agree with plan of care.   Marylene Land, CNM 01/25/2021 5:18 PM

## 2021-01-26 ENCOUNTER — Encounter: Payer: Self-pay | Admitting: *Deleted

## 2021-01-28 ENCOUNTER — Encounter: Payer: Self-pay | Admitting: Student

## 2021-01-28 ENCOUNTER — Other Ambulatory Visit: Payer: Self-pay | Admitting: Student

## 2021-01-28 DIAGNOSIS — O99019 Anemia complicating pregnancy, unspecified trimester: Secondary | ICD-10-CM | POA: Insufficient documentation

## 2021-01-28 MED ORDER — FERROUS FUMARATE 325 (106 FE) MG PO TABS: 1.0000 | ORAL_TABLET | ORAL | 2 refills | Status: DC

## 2021-02-03 ENCOUNTER — Encounter: Payer: Self-pay | Admitting: General Practice

## 2021-02-10 ENCOUNTER — Encounter: Payer: Self-pay | Admitting: *Deleted

## 2021-02-16 ENCOUNTER — Encounter: Payer: Self-pay | Admitting: Obstetrics and Gynecology

## 2021-02-25 ENCOUNTER — Encounter: Payer: Self-pay | Admitting: Family Medicine

## 2021-02-25 ENCOUNTER — Ambulatory Visit (INDEPENDENT_AMBULATORY_CARE_PROVIDER_SITE_OTHER): Payer: Medicaid Other | Admitting: Family Medicine

## 2021-02-25 ENCOUNTER — Other Ambulatory Visit: Payer: Self-pay | Admitting: Family Medicine

## 2021-02-25 ENCOUNTER — Other Ambulatory Visit: Payer: Self-pay

## 2021-02-25 VITALS — BP 146/88 | HR 75 | Wt 238.7 lb

## 2021-02-25 DIAGNOSIS — D649 Anemia, unspecified: Secondary | ICD-10-CM

## 2021-02-25 DIAGNOSIS — O10912 Unspecified pre-existing hypertension complicating pregnancy, second trimester: Secondary | ICD-10-CM

## 2021-02-25 DIAGNOSIS — N390 Urinary tract infection, site not specified: Secondary | ICD-10-CM

## 2021-02-25 DIAGNOSIS — O0990 Supervision of high risk pregnancy, unspecified, unspecified trimester: Secondary | ICD-10-CM

## 2021-02-25 DIAGNOSIS — O99212 Obesity complicating pregnancy, second trimester: Secondary | ICD-10-CM

## 2021-02-25 DIAGNOSIS — Z3A17 17 weeks gestation of pregnancy: Secondary | ICD-10-CM

## 2021-02-25 DIAGNOSIS — O2342 Unspecified infection of urinary tract in pregnancy, second trimester: Secondary | ICD-10-CM

## 2021-02-25 DIAGNOSIS — O99210 Obesity complicating pregnancy, unspecified trimester: Secondary | ICD-10-CM

## 2021-02-25 DIAGNOSIS — O99012 Anemia complicating pregnancy, second trimester: Secondary | ICD-10-CM

## 2021-02-25 DIAGNOSIS — E669 Obesity, unspecified: Secondary | ICD-10-CM

## 2021-02-25 DIAGNOSIS — B9629 Other Escherichia coli [E. coli] as the cause of diseases classified elsewhere: Secondary | ICD-10-CM

## 2021-02-25 DIAGNOSIS — O10919 Unspecified pre-existing hypertension complicating pregnancy, unspecified trimester: Secondary | ICD-10-CM

## 2021-02-25 NOTE — Progress Notes (Signed)
Pt BP was elevated today R-148/93 & L-146/88. Pt stated that she has not taken her Procardia this morning yet. Asked Pt is she feeling movement yet & she claims that she has but it has been less, explained movement usually starts around 16 to 20 weeks,FHR was 155 & baby was active.

## 2021-02-25 NOTE — Progress Notes (Signed)
I connected with Suzanne Lambert 02/25/21 at  9:00 AM EDT by: MyChart video and verified that I am speaking with the correct person using two identifiers.  Patient is located at [employer] [facility] and provider is located at home.     The purpose of this virtual visit is to provide medical care while limiting exposure to the novel coronavirus. I discussed the limitations, risks, security and privacy concerns of performing an evaluation and management service by MyChart video and the availability of in person appointments. I also discussed with the patient that there may be a patient responsible charge related to this service. By engaging in this virtual visit, you consent to the provision of healthcare.  Additionally, you authorize for your insurance to be billed for the services provided during this visit.  The patient expressed understanding and agreed to proceed.  The following staff members participated in the virtual visit:  Venora Maples, MD/MPH Attending Family Medicine Physician, Faculty Practice Center for [facility], [facility]     PRENATAL VISIT NOTE  Subjective:  Suzanne Lambert is a 35 y.o. G2P0010 at [redacted]w[redacted]d  for phone visit for ongoing prenatal care.  She is currently monitored for the following issues for this high-risk pregnancy and has Supervision of high risk pregnancy, antepartum; Chronic hypertension affecting pregnancy; Obesity in pregnancy; UTI due to extended-spectrum beta lactamase (ESBL) producing Escherichia coli; and Anemia affecting pregnancy on their problem list.  Patient reports no complaints.  Contractions: Not present. Vag. Bleeding: None.  Movement: Present. Denies leaking of fluid.   The following portions of the patient's history were reviewed and updated as appropriate: allergies, current medications, past family history, past medical history, past social history, past surgical history and problem list.   Objective:   Vitals:    02/25/21 0918 02/25/21 0921  BP: (!) 148/93 (!) 146/88  Pulse: 83 75  Weight: 238 lb 11.2 oz (108.3 kg)    Self-Obtained  Fetal Status: Fetal Heart Rate (bpm): 155   Movement: Present     Assessment and Plan:  Pregnancy: G2P0010 at [redacted]w[redacted]d 1. Supervision of high risk pregnancy, antepartum Mildly elevated BP but has not had meds this morning FHR normal AFP today  2. Chronic hypertension affecting pregnancy Has not had meds today Defer dose increase  3. UTI due to extended-spectrum beta lactamase (ESBL) producing Escherichia coli TOC today  4. Obesity in pregnancy   5. Anemia affecting pregnancy in second trimester Taking PO iron every other day  Preterm labor symptoms and general obstetric precautions including but not limited to vaginal bleeding, contractions, leaking of fluid and fetal movement were reviewed in detail with the patient.  Return in 4 weeks (on 03/25/2021) for [facility], ob visit.  Future Appointments  Date Time Provider Department Center  03/07/2021  8:45 AM [facility] NURSE [facility]  03/07/2021  9:00 AM [facility] US1 [facility]     Time spent on virtual visit: 12 minutes  Venora Maples, MD

## 2021-02-25 NOTE — Patient Instructions (Signed)

## 2021-02-27 LAB — URINE CULTURE, OB REFLEX

## 2021-02-27 LAB — CULTURE, OB URINE

## 2021-03-07 ENCOUNTER — Encounter: Payer: Self-pay | Admitting: *Deleted

## 2021-03-07 ENCOUNTER — Other Ambulatory Visit: Payer: Self-pay | Admitting: Student

## 2021-03-07 ENCOUNTER — Ambulatory Visit: Payer: Medicaid Other | Attending: Student

## 2021-03-07 ENCOUNTER — Other Ambulatory Visit: Payer: Self-pay

## 2021-03-07 ENCOUNTER — Other Ambulatory Visit: Payer: Self-pay | Admitting: Family Medicine

## 2021-03-07 ENCOUNTER — Ambulatory Visit: Payer: Medicaid Other | Admitting: *Deleted

## 2021-03-07 ENCOUNTER — Ambulatory Visit (HOSPITAL_BASED_OUTPATIENT_CLINIC_OR_DEPARTMENT_OTHER): Payer: Medicaid Other | Admitting: Obstetrics

## 2021-03-07 ENCOUNTER — Other Ambulatory Visit: Payer: Self-pay | Admitting: *Deleted

## 2021-03-07 VITALS — BP 143/81 | HR 88

## 2021-03-07 DIAGNOSIS — O09522 Supervision of elderly multigravida, second trimester: Secondary | ICD-10-CM

## 2021-03-07 DIAGNOSIS — O10912 Unspecified pre-existing hypertension complicating pregnancy, second trimester: Secondary | ICD-10-CM

## 2021-03-07 DIAGNOSIS — O10919 Unspecified pre-existing hypertension complicating pregnancy, unspecified trimester: Secondary | ICD-10-CM | POA: Diagnosis present

## 2021-03-07 DIAGNOSIS — O3432 Maternal care for cervical incompetence, second trimester: Secondary | ICD-10-CM | POA: Diagnosis present

## 2021-03-07 DIAGNOSIS — O99210 Obesity complicating pregnancy, unspecified trimester: Secondary | ICD-10-CM | POA: Insufficient documentation

## 2021-03-07 DIAGNOSIS — O358XX1 Maternal care for other (suspected) fetal abnormality and damage, fetus 1: Secondary | ICD-10-CM

## 2021-03-07 DIAGNOSIS — O26872 Cervical shortening, second trimester: Secondary | ICD-10-CM | POA: Diagnosis present

## 2021-03-07 DIAGNOSIS — O0990 Supervision of high risk pregnancy, unspecified, unspecified trimester: Secondary | ICD-10-CM | POA: Insufficient documentation

## 2021-03-07 DIAGNOSIS — Z3A19 19 weeks gestation of pregnancy: Secondary | ICD-10-CM | POA: Insufficient documentation

## 2021-03-07 DIAGNOSIS — O35BXX1 Maternal care for other (suspected) fetal abnormality and damage, fetal cardiac anomalies, fetus 1: Secondary | ICD-10-CM

## 2021-03-07 DIAGNOSIS — O26879 Cervical shortening, unspecified trimester: Secondary | ICD-10-CM

## 2021-03-07 MED ORDER — PROGESTERONE 200 MG PO CAPS: 200.0000 mg | ORAL_CAPSULE | Freq: Every day | ORAL | 11 refills | Status: DC

## 2021-03-07 NOTE — Progress Notes (Signed)
Vaginal progesterone for new diagnosis of cervical insufficiency

## 2021-03-07 NOTE — Progress Notes (Signed)
MFM Note  Suzanne Lambert was seen for a detailed fetal anatomy scan due to advanced maternal age and chronic hypertension treated with Procardia 60 mg daily.  Her pregnancy history includes two prior elective terminations of pregnancies.  She denies any other significant past medical history and denies any problems in her current pregnancy.    She had a cell free DNA test earlier in her pregnancy which indicated a low risk for trisomy 71, 40, and 13. A female fetus is predicted.   She was informed that the fetal growth and amniotic fluid level were appropriate for her gestational age.   On today's exam, an intracardiac echogenic focus was noted in the left ventricle of the fetal heart.  The small association between an echogenic focus and Down syndrome was discussed. Due to the echogenic focus noted today, the patient was offered and declined an amniocentesis today for definitive diagnosis of fetal aneuploidy.  She reports that she is comfortable with her negative cell free DNA test.  The patient was informed that anomalies may be missed due to technical limitations. If the fetus is in a suboptimal position or maternal habitus is increased, visualization of the fetus in the maternal uterus may be impaired.  The following were discussed during today's consultation:  Sonographically detected shortened cervix with funneling  During the performance of her abdominal scan, funneling of her cervix was suspected.  Due to this indication, a transvaginal ultrasound was performed today showing significant cervical funneling.  The gestational sac appears to be funneled into her cervix.  Her overall cervical length was 0.55 cm long.  Despite the funneling, her cervical tissue still appears to be thick on either side of the funnel.  The increased risk of a preterm birth due to the significant funneling of her cervix was discussed today.  The patient does not have a history of a prior preterm birth and denies any  prior surgeries to her cervix.  She denies any lower abdominal cramping or contractions.  Management options for a sonographically detected shortened cervix including daily vaginal progesterone and a cervical cerclage were discussed today.  The patient declined to have a cerclage placed and would prefer to use daily vaginal progesterone.  She was advised that vaginal progesterone has been shown to decrease the risk of a preterm birth in women who have a sonographically detected shortened cervix.    A prescription for vaginal progesterone was sent to the patient's pharmacy today.  Pelvic rest was advised.  We will continue to follow her closely with serial cervical length measurements.  The patient understands that should she deliver prior to 23 to 24 weeks, her baby would be considered nonviable.  Should she have a previable delivery, she should have a prophylactic cerclage placed at between 13 to 14 weeks of her future pregnancy.  To help improve her obstetrical outcome, consideration may be given to inpatient management (starting at 23 to 24 weeks) to enable her to reach a more optimal gestational age for delivery.  Chronic hypertension in pregnancy  The implications and management of chronic hypertension in pregnancy was discussed. The patient was advised that should her blood pressures continue to be elevated, the dosage of her antihypertensive medications may need to be increased.  The increased risk of superimposed preeclampsia, an indicated preterm delivery, and possible fetal growth restriction due to chronic hypertension in pregnancy was discussed.    Weekly fetal testing should be started at around 32 weeks (should she remain undelivered).   To  decrease her risk of superimposed preeclampsia, she should start taking a daily baby aspirin (81 mg daily) for preeclampsia prophylaxis.   Another cervical length measurement was scheduled in our office in 2 weeks.  The patient stated that all  of her questions have been answered to her complete satisfaction.  A total of 30 minutes was spent counseling and coordinating the care for this patient.  Greater than 50% of the time was spent in direct face-to-face contact.  Recommendations:  Daily vaginal progesterone Daily baby aspirin for preeclampsia prophylaxis Consider hospitalization at between 23 to 24 weeks Continue Procardia Repeat cervical length measurement in 2 weeks

## 2021-03-15 ENCOUNTER — Observation Stay (HOSPITAL_COMMUNITY)
Admission: AD | Admit: 2021-03-15 | Discharge: 2021-03-16 | Disposition: A | Discharge status: Home / Self Care | Payer: Medicaid Other | Attending: Obstetrics & Gynecology | Admitting: Obstetrics & Gynecology

## 2021-03-15 ENCOUNTER — Encounter (HOSPITAL_COMMUNITY): Payer: Self-pay | Admitting: Obstetrics and Gynecology

## 2021-03-15 ENCOUNTER — Other Ambulatory Visit: Payer: Self-pay

## 2021-03-15 ENCOUNTER — Inpatient Hospital Stay (HOSPITAL_BASED_OUTPATIENT_CLINIC_OR_DEPARTMENT_OTHER): Payer: Medicaid Other

## 2021-03-15 DIAGNOSIS — Z79899 Other long term (current) drug therapy: Secondary | ICD-10-CM | POA: Insufficient documentation

## 2021-03-15 DIAGNOSIS — O42913 Preterm premature rupture of membranes, unspecified as to length of time between rupture and onset of labor, third trimester: Secondary | ICD-10-CM | POA: Diagnosis not present

## 2021-03-15 DIAGNOSIS — Z3A30 30 weeks gestation of pregnancy: Secondary | ICD-10-CM

## 2021-03-15 DIAGNOSIS — Z3A20 20 weeks gestation of pregnancy: Secondary | ICD-10-CM | POA: Diagnosis not present

## 2021-03-15 DIAGNOSIS — O10012 Pre-existing essential hypertension complicating pregnancy, second trimester: Secondary | ICD-10-CM | POA: Diagnosis not present

## 2021-03-15 DIAGNOSIS — O42912 Preterm premature rupture of membranes, unspecified as to length of time between rupture and onset of labor, second trimester: Secondary | ICD-10-CM | POA: Diagnosis not present

## 2021-03-15 DIAGNOSIS — Z20822 Contact with and (suspected) exposure to covid-19: Secondary | ICD-10-CM | POA: Insufficient documentation

## 2021-03-15 DIAGNOSIS — Z0371 Encounter for suspected problem with amniotic cavity and membrane ruled out: Secondary | ICD-10-CM

## 2021-03-15 DIAGNOSIS — O99891 Other specified diseases and conditions complicating pregnancy: Secondary | ICD-10-CM

## 2021-03-15 DIAGNOSIS — R109 Unspecified abdominal pain: Secondary | ICD-10-CM

## 2021-03-15 DIAGNOSIS — O99210 Obesity complicating pregnancy, unspecified trimester: Secondary | ICD-10-CM

## 2021-03-15 DIAGNOSIS — O4290 Premature rupture of membranes, unspecified as to length of time between rupture and onset of labor, unspecified weeks of gestation: Secondary | ICD-10-CM | POA: Diagnosis present

## 2021-03-15 DIAGNOSIS — Z87891 Personal history of nicotine dependence: Secondary | ICD-10-CM | POA: Insufficient documentation

## 2021-03-15 DIAGNOSIS — Z8759 Personal history of other complications of pregnancy, childbirth and the puerperium: Secondary | ICD-10-CM

## 2021-03-15 LAB — COMPREHENSIVE METABOLIC PANEL
ALT: 23 U/L (ref 0–44)
AST: 21 U/L (ref 15–41)
Albumin: 3.2 g/dL — ABNORMAL LOW (ref 3.5–5.0)
Alkaline Phosphatase: 42 U/L (ref 38–126)
Anion gap: 9 (ref 5–15)
BUN: 6 mg/dL (ref 6–20)
CO2: 20 mmol/L — ABNORMAL LOW (ref 22–32)
Calcium: 9.5 mg/dL (ref 8.9–10.3)
Chloride: 106 mmol/L (ref 98–111)
Creatinine, Ser: 0.57 mg/dL (ref 0.44–1.00)
GFR, Estimated: >60 mL/min (ref >60)
Glucose, Bld: 84 mg/dL (ref 70–99)
Potassium: 3.4 mmol/L — ABNORMAL LOW (ref 3.5–5.1)
Sodium: 135 mmol/L (ref 135–145)
Total Bilirubin: 0.3 mg/dL (ref 0.3–1.2)
Total Protein: 6.6 g/dL (ref 6.5–8.1)

## 2021-03-15 LAB — TYPE AND SCREEN
ABO/RH(D): O POS
Antibody Screen: NEGATIVE

## 2021-03-15 LAB — CBC
HCT: 34.8 % — ABNORMAL LOW (ref 36.0–46.0)
Hemoglobin: 11.5 g/dL — ABNORMAL LOW (ref 12.0–15.0)
MCH: 26.9 pg (ref 26.0–34.0)
MCHC: 33 g/dL (ref 30.0–36.0)
MCV: 81.3 fL (ref 80.0–100.0)
Platelets: 359 10*3/uL (ref 150–400)
RBC: 4.28 MIL/uL (ref 3.87–5.11)
RDW: 20.6 % — ABNORMAL HIGH (ref 11.5–15.5)
WBC: 12.1 10*3/uL — ABNORMAL HIGH (ref 4.0–10.5)
nRBC: 0 % (ref 0.0–0.2)

## 2021-03-15 LAB — AMNISURE RUPTURE OF MEMBRANE (ROM) NOT AT ARMC: Amnisure ROM: NEGATIVE

## 2021-03-15 LAB — RESP PANEL BY RT-PCR (FLU A&B, COVID) ARPGX2
Influenza A by PCR: NEGATIVE
Influenza B by PCR: NEGATIVE
SARS Coronavirus 2 by RT PCR: NEGATIVE

## 2021-03-15 LAB — POCT FERN TEST: POCT Fern Test: NEGATIVE

## 2021-03-15 MED ORDER — DOCUSATE SODIUM 100 MG PO CAPS
100.0000 mg | ORAL_CAPSULE | Freq: Every day | ORAL | Status: DC
  Administered 2021-03-16: 100 mg via ORAL
  Filled 2021-03-15: qty 1

## 2021-03-15 MED ORDER — LABETALOL HCL 5 MG/ML IV SOLN: 80.0000 mg | INTRAVENOUS | Status: DC | PRN

## 2021-03-15 MED ORDER — ZOLPIDEM TARTRATE 5 MG PO TABS: 5.0000 mg | ORAL_TABLET | Freq: Every evening | ORAL | Status: DC | PRN

## 2021-03-15 MED ORDER — LABETALOL HCL 5 MG/ML IV SOLN: 40.0000 mg | INTRAVENOUS | Status: DC | PRN

## 2021-03-15 MED ORDER — LABETALOL HCL 5 MG/ML IV SOLN
20.0000 mg | INTRAVENOUS | Status: DC | PRN
  Administered 2021-03-15: 20 mg via INTRAVENOUS
  Filled 2021-03-15: qty 4

## 2021-03-15 MED ORDER — CALCIUM CARBONATE ANTACID 500 MG PO CHEW: 2.0000 | CHEWABLE_TABLET | ORAL | Status: DC | PRN

## 2021-03-15 MED ORDER — PRENATAL MULTIVITAMIN CH
1.0000 | ORAL_TABLET | Freq: Every day | ORAL | Status: DC
  Administered 2021-03-16: 1 via ORAL
  Filled 2021-03-15: qty 1

## 2021-03-15 MED ORDER — ACETAMINOPHEN 325 MG PO TABS: 650.0000 mg | ORAL_TABLET | ORAL | Status: DC | PRN

## 2021-03-15 MED ORDER — HYDRALAZINE HCL 20 MG/ML IJ SOLN
10.0000 mg | INTRAMUSCULAR | Status: DC | PRN
Start: 2021-03-15 — End: 2021-03-16

## 2021-03-15 NOTE — MAU Note (Addendum)
Pt states she woke up at 1610  and  felt a gush of clear fluid. Says she feels non-painful pressure or left side She denies bleeding. FHR 154 doppler. Pt has short cervix.

## 2021-03-15 NOTE — MAU Provider Note (Addendum)
History     CSN: [id number]  Arrival date and time: 03/15/21 1732   Event Date/Time   First Provider Initiated Contact with Patient 03/15/21 1823      Chief Complaint  Patient presents with   Rupture of Membranes   HPI Suzanne Lambert is a 35 y.o. G2P0010 at [redacted]w[redacted]d who presents to MAU with concern for rupture of membranes. Patient endorses feeling a gush of fluid today at about 1600 hours. Patient denies continuous leaking of fluid. She also denies abdominal pain, dysuria, fever or recent illness. She is remote from sexual intercourse.  OB History     Gravida  2   Para  0   Term  0   Preterm  0   AB  1   Living  0      SAB  0   IAB  1   Ectopic  0   Multiple  0   Live Births  0           Past Medical History:  Diagnosis Date   Hypertension    Vaginal Pap smear, abnormal 2011   [facility]    Past Surgical History:  Procedure Laterality Date   MOUTH SURGERY  2020    Family History  Problem Relation Age of Onset   Diabetes Mother    Hypertension Mother    Stroke Father    Hypertension Father    Heart disease Brother    Stroke Brother     Social History   Tobacco Use   Smoking status: Former    Packs/day: 0.50    Years: 15.00    Pack years: 7.50    Types: Cigarettes    Quit date: 06/08/2020    Years since quitting: 0.7   Smokeless tobacco: Never  Vaping Use   Vaping Use: Never used  Substance Use Topics   Alcohol use: Not Currently    Comment: social   Drug use: Not Currently    Types: Marijuana    Comment: last used 05/2020    Allergies: No Known Allergies  Medications Prior to Admission  Medication Sig Dispense Refill Last Dose   ferrous fumarate (HEMOCYTE - 106 MG FE) 325 (106 Fe) MG TABS tablet Take 1 tablet (106 mg of iron total) by mouth every other day. 30 tablet 2 03/14/2021   NIFEdipine (PROCARDIA XL) 60 MG 24 hr tablet Take 1 tablet (60 mg total) by mouth daily. 90 tablet 1 03/15/2021   Prenatal Vit-Fe Fumarate-FA  (MULTIVITAMIN-PRENATAL) 27-0.8 MG TABS tablet Take 1 tablet by mouth daily at 12 noon. 30 tablet 12 03/14/2021   progesterone (PROMETRIUM) 200 MG capsule Place 1 capsule (200 mg total) vaginally daily. 30 capsule 11 03/14/2021   Prenatal Vit-Fe Fumarate-FA (PRENATAL PO) Take by mouth.       Review of Systems  Genitourinary:  Positive for vaginal discharge.  Physical Exam   Blood pressure (!) 142/84, pulse 72, resp. rate 19, last menstrual period 10/23/2020, SpO2 100 %.  Physical Exam Vitals and nursing note reviewed. Exam conducted with a chaperone present.  Constitutional:      Appearance: Normal appearance.  Cardiovascular:     Rate and Rhythm: Normal rate.     Pulses: Normal pulses.     Heart sounds: Normal heart sounds.  Pulmonary:     Effort: Pulmonary effort is normal.     Breath sounds: Normal breath sounds.  Abdominal:     Comments: Gravid  Genitourinary:    Comments: Pelvic exam: External  genitalia normal, vaginal walls pink and well rugated, cervix visually closed, no lesions noted.    Skin:    Capillary Refill: Capillary refill takes less than 2 seconds.  Neurological:     Mental Status: She is alert and oriented to person, place, and time.  Psychiatric:        Mood and Affect: Mood normal.        Behavior: Behavior normal.        Thought Content: Thought content normal.        Judgment: Judgment normal.    MAU Course  Procedures --Funneling and shortened cervix noted on imaging from  --Patient actively leaking large volumes of fluid s/p bedside ultrasound --Revisited previous discussions regarding previable gestational age --No measurable cervix on tonight's MFM scan --Dr. Debroah Loop inbound to MAU to discuss options  Report given to Wynelle Bourgeois, CNM who assumes care of patient at this time  Clayton Bibles, MSN, CNM Certified Nurse Midwife, [facility] for [employer], [facility] 03/15/21 8:00 PM   Assessment and Plan   A:  Single IUP at [redacted]w[redacted]d      PPROM  P:  Seen by Dr Debroah Loop       Admit for observation      MD to follow

## 2021-03-15 NOTE — H&P (Signed)
History    CSN: [id number]   Arrival date and time: 03/15/21 1732    Event Date/Time   First Provider Initiated Contact with Patient 03/15/21 1823          Chief Complaint  Patient presents with   Rupture of Membranes    HPI Suzanne Lambert is a 35 y.o. G2P0010 at [redacted]w[redacted]d who presents to MAU with concern for rupture of membranes. Patient endorses feeling a gush of fluid today at about 1600 hours. Patient denies continuous leaking of fluid. She also denies abdominal pain, dysuria, fever or recent illness. She is remote from sexual intercourse.   OB History       Gravida  2   Para  0   Term  0   Preterm  0   AB  1   Living  0        SAB  0   IAB  1   Ectopic  0   Multiple  0   Live Births  0                   Past Medical History:  Diagnosis Date   Hypertension     Vaginal Pap smear, abnormal 2011    [facility]           Past Surgical History:  Procedure Laterality Date   MOUTH SURGERY   2020           Family History  Problem Relation Age of Onset   Diabetes Mother     Hypertension Mother     Stroke Father     Hypertension Father     Heart disease Brother     Stroke Brother        Social History         Tobacco Use   Smoking status: Former      Packs/day: 0.50      Years: 15.00      Pack years: 7.50      Types: Cigarettes      Quit date: 06/08/2020      Years since quitting: 0.7   Smokeless tobacco: Never  Vaping Use   Vaping Use: Never used  Substance Use Topics   Alcohol use: Not Currently      Comment: social   Drug use: Not Currently      Types: Marijuana      Comment: last used 05/2020      Allergies: No Known Allergies          Medications Prior to Admission  Medication Sig Dispense Refill Last Dose   ferrous fumarate (HEMOCYTE - 106 MG FE) 325 (106 Fe) MG TABS tablet Take 1 tablet (106 mg of iron total) by mouth every other day. 30 tablet 2 03/14/2021   NIFEdipine (PROCARDIA XL) 60 MG 24 hr tablet Take 1 tablet (60 mg  total) by mouth daily. 90 tablet 1 03/15/2021   Prenatal Vit-Fe Fumarate-FA (MULTIVITAMIN-PRENATAL) 27-0.8 MG TABS tablet Take 1 tablet by mouth daily at 12 noon. 30 tablet 12 03/14/2021   progesterone (PROMETRIUM) 200 MG capsule Place 1 capsule (200 mg total) vaginally daily. 30 capsule 11 03/14/2021   Prenatal Vit-Fe Fumarate-FA (PRENATAL PO) Take by mouth.            Review of Systems  Genitourinary:  Positive for vaginal discharge.  Physical Exam    Blood pressure (!) 142/84, pulse 72, resp. rate 19, last menstrual period 10/23/2020, SpO2 100 %.   Physical Exam  Vitals and nursing note reviewed. Exam conducted with a chaperone present.  Constitutional:      Appearance: Normal appearance.  Cardiovascular:     Rate and Rhythm: Normal rate.     Pulses: Normal pulses.     Heart sounds: Normal heart sounds.  Pulmonary:     Effort: Pulmonary effort is normal.     Breath sounds: Normal breath sounds.  Abdominal:     Comments: Gravid  Genitourinary:    Comments: Pelvic exam: External genitalia normal, vaginal walls pink and well rugated, cervix visually closed, no lesions noted.      Skin:    Capillary Refill: Capillary refill takes less than 2 seconds.  Neurological:     Mental Status: She is alert and oriented to person, place, and time.  Psychiatric:        Mood and Affect: Mood normal.        Behavior: Behavior normal.        Thought Content: Thought content normal.        Judgment: Judgment normal.      MAU Course  Procedures --Funneling and shortened cervix noted on imaging from  --Patient actively leaking large volumes of fluid s/p bedside ultrasound --Revisited previous discussions regarding previable gestational age --No measurable cervix on tonight's MFM scan --Dr. Debroah Loop inbound to MAU to discuss options   Report given to Wynelle Bourgeois, CNM who assumes care of patient at this time   Clayton Bibles, MSN, CNM Certified Nurse Midwife, [facility] for  [employer], [facility] 03/15/21 8:00 PM   Attestation of Attending Supervision of Advanced Practitioner (CNM/NP/PA): Evaluation and management procedures were performed by the Advanced Practitioner under my supervision and collaboration.  I have reviewed the Advanced Practitioner's note and chart, and I agree with the management and plan. After discussion with the patient I will observe overnight and consult MFM tomorrow due to PPROM with severe prematurity    Scheryl Darter MD 03/15/2021 8:26 PM

## 2021-03-16 ENCOUNTER — Telehealth: Payer: Self-pay

## 2021-03-16 ENCOUNTER — Observation Stay (HOSPITAL_BASED_OUTPATIENT_CLINIC_OR_DEPARTMENT_OTHER): Payer: Medicaid Other

## 2021-03-16 DIAGNOSIS — O42912 Preterm premature rupture of membranes, unspecified as to length of time between rupture and onset of labor, second trimester: Secondary | ICD-10-CM

## 2021-03-16 DIAGNOSIS — O99891 Other specified diseases and conditions complicating pregnancy: Secondary | ICD-10-CM | POA: Diagnosis not present

## 2021-03-16 DIAGNOSIS — Z3A20 20 weeks gestation of pregnancy: Secondary | ICD-10-CM | POA: Diagnosis not present

## 2021-03-16 DIAGNOSIS — O3432 Maternal care for cervical incompetence, second trimester: Secondary | ICD-10-CM | POA: Diagnosis not present

## 2021-03-16 DIAGNOSIS — R109 Unspecified abdominal pain: Secondary | ICD-10-CM | POA: Diagnosis not present

## 2021-03-16 LAB — CBC WITH DIFFERENTIAL/PLATELET
Abs Immature Granulocytes: 0.04 10*3/uL (ref 0.00–0.07)
Basophils Absolute: 0 10*3/uL (ref 0.0–0.1)
Basophils Relative: 0 %
Eosinophils Absolute: 0 10*3/uL (ref 0.0–0.5)
Eosinophils Relative: 0 %
HCT: 31.6 % — ABNORMAL LOW (ref 36.0–46.0)
Hemoglobin: 10.9 g/dL — ABNORMAL LOW (ref 12.0–15.0)
Immature Granulocytes: 0 %
Lymphocytes Relative: 22 %
Lymphs Abs: 2.6 10*3/uL (ref 0.7–4.0)
MCH: 27.7 pg (ref 26.0–34.0)
MCHC: 34.5 g/dL (ref 30.0–36.0)
MCV: 80.4 fL (ref 80.0–100.0)
Monocytes Absolute: 1.2 10*3/uL — ABNORMAL HIGH (ref 0.1–1.0)
Monocytes Relative: 11 %
Neutro Abs: 7.5 10*3/uL (ref 1.7–7.7)
Neutrophils Relative %: 67 %
Platelets: 354 10*3/uL (ref 150–400)
RBC: 3.93 MIL/uL (ref 3.87–5.11)
RDW: 20.3 % — ABNORMAL HIGH (ref 11.5–15.5)
WBC: 11.4 10*3/uL — ABNORMAL HIGH (ref 4.0–10.5)
nRBC: 0 % (ref 0.0–0.2)

## 2021-03-16 LAB — AFP, SERUM, OPEN SPINA BIFIDA
AFP MoM: 1.25
AFP Value: 46.9 ng/mL
Gest. Age on Collection Date: 17.9 weeks
Maternal Age At EDD: 36.7 yr
OSBR Risk 1 IN: 10000
Test Results:: NEGATIVE
Weight: 238 [lb_av]

## 2021-03-16 MED ORDER — NIFEDIPINE ER OSMOTIC RELEASE 60 MG PO TB24
60.0000 mg | ORAL_TABLET | Freq: Every day | ORAL | Status: DC
  Administered 2021-03-16: 60 mg via ORAL
  Filled 2021-03-16: qty 1

## 2021-03-16 MED ORDER — FERROUS FUMARATE 324 (106 FE) MG PO TABS
1.0000 | ORAL_TABLET | ORAL | Status: DC
  Administered 2021-03-16: 106 mg via ORAL
  Filled 2021-03-16 (×2): qty 1

## 2021-03-16 MED ORDER — ACETAMINOPHEN 325 MG PO TABS
650.0000 mg | ORAL_TABLET | ORAL | Status: DC | PRN
Start: 2021-03-16 — End: 2021-03-28

## 2021-03-16 MED ORDER — ASPIRIN 81 MG PO CHEW
81.0000 mg | CHEWABLE_TABLET | Freq: Every day | ORAL | Status: DC
  Administered 2021-03-16: 81 mg via ORAL
  Filled 2021-03-16: qty 1

## 2021-03-16 MED ORDER — ASPIRIN 81 MG PO CHEW: 81.0000 mg | CHEWABLE_TABLET | Freq: Every day | ORAL | 3 refills | Status: DC

## 2021-03-16 NOTE — Consult Note (Signed)
Neonatology Consult Note:  At the request of the patients obstetrician Dr. Roselie Awkward  I met with Suzanne Lambert, who is a 35 y.o. G2P0010 with IUP at [id number] for previable PPROM.  Pregnancy complicated by shortened CL for which she was on vaginal progesterone. Her current plan is for hospital discharge today for expectant management.  She is aware of the concerning signs and symptoms for when she would need to come back to the hospital. She plans to limit strenuous activity, but may do normal movement in her home.      We discussed the limits of viability and offered resuscitation at 23 weeks should her pregnancy progress to that stage.  We discussed the impact PPROM can have on limiting lung growth and development.  We discussed morbidity/mortality surrounding the periviable gestional ages, as well as delivery room resuscitation, including intubation and surfactant in DR.  Discussed mechanical ventilation and risk for chronic lung disease, risk for IVH with potential for motor / cognitive deficits.  We discussed NG / OG feeds, benefits of MBM.  She is hopeful to make it to 23 weeks (or longer) and requests all resuscitative efforts.  Thank you for allowing [facility] participate in her care.  Please call with questions.  BHiginio Roger DO  Neonatologist  The total length of face-to-face or floor / unit time for this encounter was 40 minutes.  Counseling and / or coordination of care was greater than fifty percent of the time.

## 2021-03-16 NOTE — Discharge Summary (Signed)
Antenatal Physician Discharge Summary  Patient ID: SHAMS FILL MRN: [id number] DOB/AGE: 01/09/1986 35 y.o.  Admit date: 03/15/2021 Discharge date: 03/16/2021  Admission Diagnoses: Previable PPROM CHTN  Discharge Diagnoses:  Same  Prenatal Procedures: ultrasound  Consults: Neonatology, Maternal Fetal Medicine  Hospital Course:  Suzanne Lambert is a 35 y.o. G2P0010 with IUP at [redacted]w[redacted]d admitted for previable PPROM.  She was admitted with LOF which was ultimately confirmed rupture. It was diagnosed in the s/o shortened CL for which she was doing vaginal progesterone. She had no signs or symptoms of infection. She had no bleeding. Her WBC count remained stable bt 11-12 with no evidence of bandemia/left shift. She was afebrile and VSS without tachycardia.   With her primary advocate, her sister [name], we went through all the options available related to previable PPROM. We discussed the risks of PPROM - loss, preterm labor, infection that is potentially life-threatening to the mother, abruption. We reviewed limits on neonatal outcomes primarily related to lung development in the s/o PPROM this early. NICU consult placed and she was seen by them during her stay. We discussed the ineffectiveness of antibiotics in this setting. We discussed the lack of available interventions at this time. We discussed her other option of delivery at this time. She would like expectant management.   We discussed the concerning signs and symptoms for when she would need to come back to the hospital. We discussed limiting strenuous activity, but that she may do normal movement in her home. We discussed the risk of strict bed rest of that of DVT.   We discussed a short term follow up plan of repeat US in one week, appt with MFM and with [place] along with weekly CBC. All questions answered to her and [name]'s satisfaction. I offered MFM consult if they would still like to proceed but they declined and felt satisfied with our  conversation.   Discharge Exam: Temp:  [97.6 F (36.4 C)-98.3 F (36.8 C)] 97.6 F (36.4 C) (09/07 1116) Pulse Rate:  [72-91] 86 (09/07 1116) Resp:  [16-19] 16 (09/07 1116) BP: (123-183)/(61-96) 123/66 (09/07 1116) SpO2:  [99 %-100 %] 100 % (09/07 1116) Weight:  [107.5 kg] 107.5 kg (09/06 2142)  Physical Examination: CONSTITUTIONAL: Well-developed, well-nourished female in no acute distress.  HENT:  Normocephalic, atraumatic, External right and left ear normal.  EYES: Conjunctivae and EOM are normal. Pupils are equal, round, and reactive to light. No scleral icterus.  NECK: Normal range of motion, supple, no masses SKIN: Skin is warm and dry. No rash noted. Not diaphoretic. No erythema. No pallor. NEUROLOGIC: Alert and oriented to person, place, and time. Normal reflexes, muscle tone coordination. No cranial nerve deficit noted. PSYCHIATRIC: Normal mood and affect. Normal behavior. Normal judgment and thought content. CARDIOVASCULAR: Normal heart rate noted, regular rhythm RESPIRATORY: Effort and breath sounds normal, no problems with respiration noted MUSCULOSKELETAL: Normal range of motion. No edema and no tenderness. 2+ distal pulses. ABDOMEN: Soft, nontender, nondistended, gravid. CERVIX: Visualized closed prior to confirmed ROM  Fetal monitoring: FHT: 140s   Significant Diagnostic Studies:  Results for orders placed or performed during the hospital encounter of 03/15/21 (from the past 168 hour(s))  Amnisure rupture of membrane (rom)not at [facility]   Collection Time: 03/15/21  6:08 PM  Result Value Ref Range   Amnisure ROM NEGATIVE   CBC   Collection Time: 03/15/21  6:08 PM  Result Value Ref Range   WBC 12.1 (H) 4.0 - 10.5 K/uL   RBC  4.28 3.87 - 5.11 MIL/uL   Hemoglobin 11.5 (L) 12.0 - 15.0 g/dL   HCT 16.1 (L) 09.6 - 04.5 %   MCV 81.3 80.0 - 100.0 fL   MCH 26.9 26.0 - 34.0 pg   MCHC 33.0 30.0 - 36.0 g/dL   RDW 40.9 (H) 81.1 - 91.4 %   Platelets 359 150 - 400 K/uL    nRBC 0.0 0.0 - 0.2 %  Comprehensive metabolic panel   Collection Time: 03/15/21  6:08 PM  Result Value Ref Range   Sodium 135 135 - 145 mmol/L   Potassium 3.4 (L) 3.5 - 5.1 mmol/L   Chloride 106 98 - 111 mmol/L   CO2 20 (L) 22 - 32 mmol/L   Glucose, Bld 84 70 - 99 mg/dL   BUN 6 6 - 20 mg/dL   Creatinine, Ser 7.82 0.44 - 1.00 mg/dL   Calcium 9.5 8.9 - 95.6 mg/dL   Total Protein 6.6 6.5 - 8.1 g/dL   Albumin 3.2 (L) 3.5 - 5.0 g/dL   AST 21 15 - 41 U/L   ALT 23 0 - 44 U/L   Alkaline Phosphatase 42 38 - 126 U/L   Total Bilirubin 0.3 0.3 - 1.2 mg/dL   GFR, Estimated >21 >30 mL/min   Anion gap 9 5 - 15  Type and screen [facility]   Collection Time: 03/15/21  6:08 PM  Result Value Ref Range   ABO/RH(D) O POS    Antibody Screen NEG    Sample Expiration      03/18/2021,2359 Performed at [facility], [address], [place], [place] [address]   Crist Fat Test   Collection Time: 03/15/21  9:19 PM  Result Value Ref Range   POCT Fern Test Negative = intact amniotic membranes   Resp Panel by RT-PCR (Flu A&B, Covid) Nasopharyngeal Swab   Collection Time: 03/15/21  9:54 PM   Specimen: Nasopharyngeal Swab; Nasopharyngeal(NP) swabs in vial transport medium  Result Value Ref Range   SARS Coronavirus 2 by RT PCR NEGATIVE NEGATIVE   Influenza A by PCR NEGATIVE NEGATIVE   Influenza B by PCR NEGATIVE NEGATIVE  CBC with Differential/Platelet   Collection Time: 03/16/21 12:00 PM  Result Value Ref Range   WBC 11.4 (H) 4.0 - 10.5 K/uL   RBC 3.93 3.87 - 5.11 MIL/uL   Hemoglobin 10.9 (L) 12.0 - 15.0 g/dL   HCT 46.9 (L) 62.9 - 52.8 %   MCV 80.4 80.0 - 100.0 fL   MCH 27.7 26.0 - 34.0 pg   MCHC 34.5 30.0 - 36.0 g/dL   RDW 41.3 (H) 24.4 - 01.0 %   Platelets 354 150 - 400 K/uL   nRBC 0.0 0.0 - 0.2 %   Neutrophils Relative % 67 %   Neutro Abs 7.5 1.7 - 7.7 K/uL   Lymphocytes Relative 22 %   Lymphs Abs 2.6 0.7 - 4.0 K/uL   Monocytes Relative 11 %   Monocytes Absolute 1.2 (H)  0.1 - 1.0 K/uL   Eosinophils Relative 0 %   Eosinophils Absolute 0.0 0.0 - 0.5 K/uL   Basophils Relative 0 %   Basophils Absolute 0.0 0.0 - 0.1 K/uL   Immature Granulocytes 0 %   Abs Immature Granulocytes 0.04 0.00 - 0.07 K/uL   [place] MFM OB Transvaginal  Result Date: 03/07/2021 ----------------------------------------------------------------------  OBSTETRICS REPORT                       (Signed  Final 03/07/2021 01:29 pm) ---------------------------------------------------------------------- Patient Info  ID #:       [id number]                          D.O.B.:  [birth year] (35 yrs)  Name:       Suzanne Lambert                 Visit Date: 03/07/2021 08:47 am ---------------------------------------------------------------------- Performed By  Attending:        Ma Rings MD         Ref. Address:     [address]                                                             [place]                                                             [place] [address]  Performed By:     Clayton Lefort RDMS       [facility]  Referred By:      Jorje Guild CNM ---------------------------------------------------------------------- Orders  #  Description                           Code        Ordered By  1  [place] MFM OB DETAIL +14 WK               76811.01    KATHRYN KOOISTRA  2  [place] MFM OB TRANSVAGINAL                19147.8     Luna Kitchens ----------------------------------------------------------------------  #  Order #  Accession #                Episode #  1  [id number]                   [id number]                 [id number]  2  [id number]                   [id number]                 [id number]  ---------------------------------------------------------------------- Indications  Advanced maternal age multigravida 24+,        O3.522  second trimester 11 y. o.  Hypertension - Chronic/Pre-existing            O10.019  (procardia 60 mg)  Obesity complicating pregnancy, second         O99.212  trimester BMI 36  Echogenic intracardiac focus of the heart      O35.8XX0  (EIF)  [redacted] weeks gestation of pregnancy                Z3A.19  Encounter for antenatal screening for          Z36.3  malformations  Anemia during pregnancy in second trimester    O99.012 ---------------------------------------------------------------------- Fetal Evaluation  Num Of Fetuses:         1  Fetal Heart Rate(bpm):  155  Cardiac Activity:       Observed  Presentation:           Cephalic  Placenta:               Posterior  P. Cord Insertion:      Visualized, central  Amniotic Fluid  AFI FV:      Within normal limits                              Largest Pocket(cm)                              5.9 ---------------------------------------------------------------------- Biometry  BPD:      43.5  mm     G. Age:  19w 1d         45  %    CI:        73.59   %    70 - 86                                                          FL/HC:      17.3   %    16.1 - 18.3  HC:      161.1  mm     G. Age:  18w 6d         25  %    HC/AC:      1.03        1.09 - 1.39  AC:      156.3  mm     G. Age:  20w 6d         88  %    FL/BPD:     64.1   %  FL:  27.9  mm     G. Age:  18w 4d         18  %    FL/AC:      17.9   %    20 - 24  HUM:      26.8  mm     G. Age:  18w 4d         30  %  CER:      19.3  mm     G. Age:  18w 6d         29  %  NFT:       2.7  mm  LV:        5.5  mm  CM:        3.6  mm  Est. FW:     305  gm    0 lb 11 oz      68  % ---------------------------------------------------------------------- OB History  Gravidity:    2          SAB:   1  Living:       0 ---------------------------------------------------------------------- Gestational Age  LMP:            19w 2d        Date:  10/23/20                 EDD:   07/30/21  U/S Today:     19w 3d                                        EDD:   07/29/21  Best:          19w 2d     Det. By:  LMP  (10/23/20)          EDD:   07/30/21 ---------------------------------------------------------------------- Anatomy  Cranium:               Appears normal         Aortic Arch:            Not well visualized  Cavum:                 Not well visualized    Ductal Arch:            Appears normal  Ventricles:            Appears normal         Diaphragm:              Appears normal  Choroid Plexus:        Appears normal         Stomach:                Appears normal, left                                                                        sided  Cerebellum:            Appears normal         Abdomen:  Appears normal  Posterior Fossa:       Appears normal         Abdominal Wall:         Appears nml (cord                                                                        insert, abd wall)  Nuchal Fold:           Appears normal         Cord Vessels:           Appears normal (3                                                                        vessel cord)  Face:                  Appears normal         Kidneys:                Appear normal                         (orbits and profile)  Lips:                  Appears normal         Bladder:                Appears normal  Thoracic:              Appears normal         Spine:                  Not well visualized  Heart:                 Appears normal EIF     Upper Extremities:      Appears normal  RVOT:                  Appears normal         Lower Extremities:      Appears normal  LVOT:                  Appears normal  Other:  Heels and 5th digit visualized. VC, 3VV and 3VTV visualized. Fetus          appears to be a female. ---------------------------------------------------------------------- Cervix Uterus Adnexa  Cervix  Length:           0.55  cm.  Measured  transvaginally. Funneling of internal os noted.  Right Ovary  Visualized.  Left Ovary  Visualized. ---------------------------------------------------------------------- Comments  Donnald Garre was seen for a detailed fetal anatomy scan  due to advanced maternal age and chronic hypertension  treated with Procardia 60 mg daily.  Her pregnancy history  includes two prior elective terminations of pregnancies.  She denies any other significant past medical history and  denies  any problems in her current pregnancy.  She had a cell free DNA test earlier in her pregnancy which  indicated a low risk for trisomy 39, 64, and 13. A female fetus is  predicted.  She was informed that the fetal growth and amniotic fluid  level were appropriate for her gestational age.  On today's exam, an intracardiac echogenic focus was noted  in the left ventricle of the fetal heart.  The small association  between an echogenic focus and Down syndrome was  discussed. Due to the echogenic focus noted today, the  patient was offered and declined an amniocentesis today for  definitive diagnosis of fetal aneuploidy.  She reports that she  is comfortable with her negative cell free DNA test.  The patient was informed that anomalies may be missed due  to technical limitations. If the fetus is in a suboptimal position  or maternal habitus is increased, visualization of the fetus in  the maternal uterus may be impaired.  The following were discussed during today's consultation:  Sonographically detected shortened cervix with funneling  During the performance of her abdominal scan, funneling of  her cervix was suspected.  Due to this indication, a  transvaginal ultrasound was performed today showing  significant cervical funneling.  The gestational sac appears to  be funneled into her cervix.  Her overall cervical length was  0.55 cm long.  Despite the funneling, her cervical tissue still  appears to be thick on either side of the funnel.  The increased  risk of a preterm birth due to the significant  funneling of her cervix was discussed today.  The patient  does not have a history of a prior preterm birth and denies  any prior surgeries to her cervix.  She denies any lower  abdominal cramping or contractions.  Management options for a sonographically detected  shortened cervix including daily vaginal progesterone and a  cervical cerclage were discussed today.  The patient declined  to have a cerclage placed and would prefer to use daily  vaginal progesterone.  She was advised that vaginal  progesterone has been shown to decrease the risk of a  preterm birth in women who have a sonographically detected  shortened cervix.  A prescription for vaginal progesterone was sent to the  patient's pharmacy today.  Pelvic rest was advised.  We will continue to follow her closely with serial cervical  length measurements.  The patient understands that should  she deliver prior to 23 to 24 weeks, her baby would be  considered nonviable.  Should she have a previable delivery,  she should have a prophylactic cerclage placed at between  13 to 14 weeks of her future pregnancy.  To help improve her obstetrical outcome, consideration may  be given to inpatient management (starting at 23 to 24  weeks) to enable her to reach a more optimal gestational age  for delivery.  Chronic hypertension in pregnancy  The implications and management of chronic hypertension in  pregnancy was discussed. The patient was advised that  should her blood pressures continue to be elevated, the  dosage of her antihypertensive medications may need to be  increased.  The increased risk of superimposed  preeclampsia, an indicated preterm delivery, and possible  fetal growth restriction due to chronic hypertension in  pregnancy was discussed.  Weekly fetal testing should be started at around 32 weeks  (should she remain undelivered).  To decrease her risk of superimposed preeclampsia, she  should start taking a  daily baby aspirin (81 mg daily) for  preeclampsia prophylaxis.  Another cervical length measurement was scheduled in our  office in 2 weeks.  The patient stated that all of her questions have been  answered to her complete satisfaction.  A total of 30 minutes was spent counseling and coordinating  the care for this patient.  Greater than 50% of the time was  spent in direct face-to-face contact. ---------------------------------------------------------------------- Recommendations  Daily vaginal progesterone  Daily baby aspirin for preeclampsia prophylaxis  Consider hospitalization at between 23 to 24 weeks  Continue Procardia  Repeat cervical length measurement in 2 weeks ----------------------------------------------------------------------                   Ma Rings, MD Electronically Signed Final Report   03/07/2021 01:29 pm ----------------------------------------------------------------------  [place] MFM OB DETAIL +14 WK  Result Date: 03/07/2021 ----------------------------------------------------------------------  OBSTETRICS REPORT                       (Signed Final 03/07/2021 01:29 pm) ---------------------------------------------------------------------- Patient Info  ID #:       [id number]                          D.O.B.:  01-Jan-1986 (35 yrs)  Name:       Suzanne Lambert                 Visit Date: 03/07/2021 08:47 am ---------------------------------------------------------------------- Performed By  Attending:        Ma Rings MD         Ref. Address:     [address]                                                             [place]                                                             [place] [address]  Performed By:     Clayton Lefort RDMS       [facility]  Referred By:      Jorje Guild CNM ---------------------------------------------------------------------- Orders  #  Description                           Code        Ordered By  1  [place] MFM OB DETAIL +14 WK               76811.01    KATHRYN KOOISTRA  2  [place] MFM OB TRANSVAGINAL                40981.1     KATHRYN KOOISTRA ----------------------------------------------------------------------  #  Order #                     Accession #                Episode #  1  [id number]                   [id number]                 [id number]  2  [id number]                   [id number]                 [id number] ---------------------------------------------------------------------- Indications  Advanced maternal age multigravida 64+,        O69.522  second trimester 89 y. o.  Hypertension - Chronic/Pre-existing            O10.019  (procardia 60 mg)  Obesity complicating pregnancy, second         O99.212  trimester BMI 36  Echogenic intracardiac focus of the heart      O35.8XX0  (EIF)  [redacted] weeks gestation of pregnancy                Z3A.19  Encounter for antenatal screening for          Z36.3  malformations  Anemia during pregnancy in second trimester    O99.012 ---------------------------------------------------------------------- Fetal Evaluation  Num Of Fetuses:         1  Fetal Heart Rate(bpm):  155  Cardiac Activity:       Observed  Presentation:           Cephalic  Placenta:               Posterior  P. Cord Insertion:      Visualized, central  Amniotic Fluid  AFI FV:      Within normal limits                              Largest Pocket(cm)                              5.9 ---------------------------------------------------------------------- Biometry  BPD:      43.5  mm     G. Age:  19w 1d         45  %    CI:        73.59   %    70 - 86  FL/HC:      17.3   %    16.1 - 18.3  HC:      161.1  mm     G. Age:  18w 6d         25  %    HC/AC:       1.03        1.09 - 1.39  AC:      156.3  mm     G. Age:  20w 6d         88  %    FL/BPD:     64.1   %  FL:       27.9  mm     G. Age:  18w 4d         18  %    FL/AC:      17.9   %    20 - 24  HUM:      26.8  mm     G. Age:  18w 4d         30  %  CER:      19.3  mm     G. Age:  18w 6d         29  %  NFT:       2.7  mm  LV:        5.5  mm  CM:        3.6  mm  Est. FW:     305  gm    0 lb 11 oz      68  % ---------------------------------------------------------------------- OB History  Gravidity:    2          SAB:   1  Living:       0 ---------------------------------------------------------------------- Gestational Age  LMP:           19w 2d        Date:  10/23/20                 EDD:   07/30/21  U/S Today:     19w 3d                                        EDD:   07/29/21  Best:          19w 2d     Det. By:  LMP  (10/23/20)          EDD:   07/30/21 ---------------------------------------------------------------------- Anatomy  Cranium:               Appears normal         Aortic Arch:            Not well visualized  Cavum:                 Not well visualized    Ductal Arch:            Appears normal  Ventricles:            Appears normal         Diaphragm:              Appears normal  Choroid Plexus:        Appears normal         Stomach:  Appears normal, left                                                                        sided  Cerebellum:            Appears normal         Abdomen:                Appears normal  Posterior Fossa:       Appears normal         Abdominal Wall:         Appears nml (cord                                                                        insert, abd wall)  Nuchal Fold:           Appears normal         Cord Vessels:           Appears normal (3                                                                        vessel cord)  Face:                  Appears normal         Kidneys:                Appear normal                         (orbits and profile)  Lips:                   Appears normal         Bladder:                Appears normal  Thoracic:              Appears normal         Spine:                  Not well visualized  Heart:                 Appears normal EIF     Upper Extremities:      Appears normal  RVOT:                  Appears normal         Lower Extremities:      Appears normal  LVOT:                  Appears normal  Other:  Heels and 5th  digit visualized. VC, 3VV and 3VTV visualized. Fetus          appears to be a female. ---------------------------------------------------------------------- Cervix Uterus Adnexa  Cervix  Length:           0.55  cm.  Measured transvaginally. Funneling of internal os noted.  Right Ovary  Visualized.  Left Ovary  Visualized. ---------------------------------------------------------------------- Comments  Donnald Garre was seen for a detailed fetal anatomy scan  due to advanced maternal age and chronic hypertension  treated with Procardia 60 mg daily.  Her pregnancy history  includes two prior elective terminations of pregnancies.  She denies any other significant past medical history and  denies any problems in her current pregnancy.  She had a cell free DNA test earlier in her pregnancy which  indicated a low risk for trisomy 19, 20, and 13. A female fetus is  predicted.  She was informed that the fetal growth and amniotic fluid  level were appropriate for her gestational age.  On today's exam, an intracardiac echogenic focus was noted  in the left ventricle of the fetal heart.  The small association  between an echogenic focus and Down syndrome was  discussed. Due to the echogenic focus noted today, the  patient was offered and declined an amniocentesis today for  definitive diagnosis of fetal aneuploidy.  She reports that she  is comfortable with her negative cell free DNA test.  The patient was informed that anomalies may be missed due  to technical limitations. If the fetus is in a suboptimal position  or maternal habitus is  increased, visualization of the fetus in  the maternal uterus may be impaired.  The following were discussed during today's consultation:  Sonographically detected shortened cervix with funneling  During the performance of her abdominal scan, funneling of  her cervix was suspected.  Due to this indication, a  transvaginal ultrasound was performed today showing  significant cervical funneling.  The gestational sac appears to  be funneled into her cervix.  Her overall cervical length was  0.55 cm long.  Despite the funneling, her cervical tissue still  appears to be thick on either side of the funnel.  The increased risk of a preterm birth due to the significant  funneling of her cervix was discussed today.  The patient  does not have a history of a prior preterm birth and denies  any prior surgeries to her cervix.  She denies any lower  abdominal cramping or contractions.  Management options for a sonographically detected  shortened cervix including daily vaginal progesterone and a  cervical cerclage were discussed today.  The patient declined  to have a cerclage placed and would prefer to use daily  vaginal progesterone.  She was advised that vaginal  progesterone has been shown to decrease the risk of a  preterm birth in women who have a sonographically detected  shortened cervix.  A prescription for vaginal progesterone was sent to the  patient's pharmacy today.  Pelvic rest was advised.  We will continue to follow her closely with serial cervical  length measurements.  The patient understands that should  she deliver prior to 23 to 24 weeks, her baby would be  considered nonviable.  Should she have a previable delivery,  she should have a prophylactic cerclage placed at between  13 to 14 weeks of her future pregnancy.  To help improve her obstetrical outcome, consideration may  be given to inpatient management (starting at 23 to 24  weeks) to  enable her to reach a more optimal gestational age  for delivery.   Chronic hypertension in pregnancy  The implications and management of chronic hypertension in  pregnancy was discussed. The patient was advised that  should her blood pressures continue to be elevated, the  dosage of her antihypertensive medications may need to be  increased.  The increased risk of superimposed  preeclampsia, an indicated preterm delivery, and possible  fetal growth restriction due to chronic hypertension in  pregnancy was discussed.  Weekly fetal testing should be started at around 32 weeks  (should she remain undelivered).  To decrease her risk of superimposed preeclampsia, she  should start taking a daily baby aspirin (81 mg daily) for  preeclampsia prophylaxis.  Another cervical length measurement was scheduled in our  office in 2 weeks.  The patient stated that all of her questions have been  answered to her complete satisfaction.  A total of 30 minutes was spent counseling and coordinating  the care for this patient.  Greater than 50% of the time was  spent in direct face-to-face contact. ---------------------------------------------------------------------- Recommendations  Daily vaginal progesterone  Daily baby aspirin for preeclampsia prophylaxis  Consider hospitalization at between 23 to 24 weeks  Continue Procardia  Repeat cervical length measurement in 2 weeks ----------------------------------------------------------------------                   Ma RingsVictor Fang, MD Electronically Signed Final Report   03/07/2021 01:29 pm ----------------------------------------------------------------------  US MFM OB Limited  Result Date: 03/16/2021 ----------------------------------------------------------------------  OBSTETRICS REPORT                        (Signed Final 03/16/2021 09:47 am) ---------------------------------------------------------------------- Patient Info  ID #:       [id number]                          D.O.B.:  1985-09-13 (35 yrs)  Name:       Suzanne Lambert                 Visit  Date: 03/15/2021 06:43 pm ---------------------------------------------------------------------- Performed By  Attending:        Lin Landsmanorenthian Booker      Ref. Address:      [address]                    [place]                                                              [place]                                                              [place] [id number]  Performed By:     Hurman HornAlyssa Keatts,         [facility]  Referred By:      Jorje Guild CNM ---------------------------------------------------------------------- Orders  #  Description                           Code        Ordered By  1  [place] MFM OB LIMITED                     82956.21    Clayton Bibles ----------------------------------------------------------------------  #  Order #                     Accession #                Episode #  1  [id number]                   [id number]                 [id number] ---------------------------------------------------------------------- Indications  Leakage of amniotic fluid                       O42.90  [redacted] weeks gestation of pregnancy                 Z3A.20  Abdominal pain in pregnancy                     O99.89  Encounter for antenatal screening,              Z36.9  unspecified ---------------------------------------------------------------------- Fetal Evaluation  Num Of Fetuses:          1  Fetal Heart Rate(bpm):   152  Cardiac Activity:        Observed  Presentation:            Cephalic  Placenta:                Posterior  P. Cord Insertion:       Visualized, central  Amniotic Fluid  AFI FV:      Within normal limits                              Largest Pocket(cm)                              7  Comment:    Stomach, bladder, and diaphragm seen. No placental abruption              or previa identified.  ---------------------------------------------------------------------- OB History  Gravidity:    2          SAB:   1  Living:       0 ---------------------------------------------------------------------- Gestational Age  LMP:           20w 3d        Date:  10/23/20  EDD:   07/30/21  Best:          Cherylann Parr 3d     Det. By:  LMP  (10/23/20)          EDD:   07/30/21 ---------------------------------------------------------------------- Cervix Uterus Adnexa  Cervix  Appears funnelled, see comments.  Comment  No measurable cervix noted, fluid appears to  extend all the way to vagina. ---------------------------------------------------------------------- Impression  Limited exam to assess suspected PPROM  Normal amniotic fluid observed however, fluid was loss  during the exam per sonographer.  Good fetal movement.  Cervix appears shortened and dialated. Transvaginal  ultrasound was deferred prior to clinical examination. ---------------------------------------------------------------------- Recommendations  Clinical correlation recommended. ----------------------------------------------------------------------               Lin Landsman, MD Electronically Signed Final Report   03/16/2021 09:47 am ----------------------------------------------------------------------   Future Appointments  Date Time Provider Department Center  03/24/2021  7:45 AM [facility] NURSE [facility]  03/24/2021  8:00 AM [facility] US1 [facility]  03/24/2021  9:35 AM Warden Fillers, MD [facility]  03/29/2021  9:35 AM Venora Maples, MD [facility]  04/04/2021 10:30 AM [facility] NURSE [facility]  04/04/2021 10:45 AM [facility] US4 [facility]    Discharge Condition: Stable     Allergies as of 03/16/2021   No Known Allergies      Medication List     STOP taking these medications    progesterone 200 MG capsule Commonly known as: Prometrium       TAKE these medications    acetaminophen 325 MG  tablet Commonly known as: TYLENOL Take 2 tablets (650 mg total) by mouth every 4 (four) hours as needed (for pain scale < 4  OR  temperature  >/=  100.5 F).   aspirin 81 MG chewable tablet Chew 1 tablet (81 mg total) by mouth daily. Start taking on: March 17, 2021   ferrous fumarate 325 (106 Fe) MG Tabs tablet Commonly known as: HEMOCYTE - 106 mg FE Take 1 tablet (106 mg of iron total) by mouth every other day.   multivitamin-prenatal 27-0.8 MG Tabs tablet Take 1 tablet by mouth daily at 12 noon. What changed: Another medication with the same name was removed. Continue taking this medication, and follow the directions you see here.   NIFEdipine 60 MG 24 hr tablet Commonly known as: Procardia XL Take 1 tablet (60 mg total) by mouth daily.         Total discharge time: 1 hour   Signed: Milas Hock M.D. 03/16/2021, 1:27 PM

## 2021-03-16 NOTE — Progress Notes (Signed)
Patient ID: Suzanne Lambert, female   DOB: August 30, 1985, 35 y.o.   MRN: [id number] FACULTY PRACTICE ANTEPARTUM(COMPREHENSIVE) NOTE  Suzanne Lambert is a 35 y.o. G2P0010 at [redacted]w[redacted]d by LMP who is admitted for rupture of membranes.   Fetal presentation is cephalic. Length of Stay:  0  Days  Subjective: No leaking this am Patient reports the fetal movement as active. Patient reports uterine contraction  activity as none. Patient reports  vaginal bleeding as none. Patient describes fluid per vagina as Clear.  Vitals:  Blood pressure (!) 145/80, pulse 80, temperature 97.8 F (36.6 C), temperature source Oral, resp. rate 16, height 5\' 9"  (1.753 m), weight 107.5 kg, last menstrual period 10/23/2020, SpO2 100 %. Physical Examination:  General appearance - alert, well appearing, and in no distress Heart - normal rate and regular rhythm Abdomen - soft, nontender, nondistended Fundal Height:  size equals dates Cervical Exam: Not evaluated. . Extremities: extremities normal, atraumatic, no cyanosis or edema and Homans sign is negative, no sign of DVT  Membranes:ruptured  Fetal Monitoring:   Fetal Heart Rate A   Mode Doppler  [done per pt request] filed at 03/16/2021 0509  Baseline Rate (A) 145 bpm filed at 03/16/2021 0509    Labs:  Results for orders placed or performed during the hospital encounter of 03/15/21 (from the past 24 hour(s))  Amnisure rupture of membrane (rom)not at [facility]   Collection Time: 03/15/21  6:08 PM  Result Value Ref Range   Amnisure ROM NEGATIVE   CBC   Collection Time: 03/15/21  6:08 PM  Result Value Ref Range   WBC 12.1 (H) 4.0 - 10.5 K/uL   RBC 4.28 3.87 - 5.11 MIL/uL   Hemoglobin 11.5 (L) 12.0 - 15.0 g/dL   HCT 05/15/21 (L) 57.0 - 17.7 %   MCV 81.3 80.0 - 100.0 fL   MCH 26.9 26.0 - 34.0 pg   MCHC 33.0 30.0 - 36.0 g/dL   RDW 93.9 (H) 03.0 - 09.2 %   Platelets 359 150 - 400 K/uL   nRBC 0.0 0.0 - 0.2 %  Comprehensive metabolic panel   Collection Time: 03/15/21  6:08  PM  Result Value Ref Range   Sodium 135 135 - 145 mmol/L   Potassium 3.4 (L) 3.5 - 5.1 mmol/L   Chloride 106 98 - 111 mmol/L   CO2 20 (L) 22 - 32 mmol/L   Glucose, Bld 84 70 - 99 mg/dL   BUN 6 6 - 20 mg/dL   Creatinine, Ser 05/15/21 0.44 - 1.00 mg/dL   Calcium 9.5 8.9 - 0.76 mg/dL   Total Protein 6.6 6.5 - 8.1 g/dL   Albumin 3.2 (L) 3.5 - 5.0 g/dL   AST 21 15 - 41 U/L   ALT 23 0 - 44 U/L   Alkaline Phosphatase 42 38 - 126 U/L   Total Bilirubin 0.3 0.3 - 1.2 mg/dL   GFR, Estimated 22.6 >33 mL/min   Anion gap 9 5 - 15  Type and screen [facility]   Collection Time: 03/15/21  6:08 PM  Result Value Ref Range   ABO/RH(D) O POS    Antibody Screen NEG    Sample Expiration      03/18/2021,2359 Performed at [facility], [address] [place], [place]   [id number] Test   Collection Time: 03/15/21  9:19 PM  Result Value Ref Range   POCT Fern Test Negative = intact amniotic membranes   Resp Panel by RT-PCR (  Flu A&B, Covid) Nasopharyngeal Swab   Collection Time: 03/15/21  9:54 PM   Specimen: Nasopharyngeal Swab; Nasopharyngeal(NP) swabs in vial transport medium  Result Value Ref Range   SARS Coronavirus 2 by RT PCR NEGATIVE NEGATIVE   Influenza A by PCR NEGATIVE NEGATIVE   Influenza B by PCR NEGATIVE NEGATIVE     Medications:  Scheduled  docusate sodium  100 mg Oral Daily   ferrous fumarate  1 tablet Oral QODAY   NIFEdipine  60 mg Oral Daily   prenatal multivitamin  1 tablet Oral Q1200   I have reviewed the patient's current medications.  ASSESSMENT: Patient Active Problem List   Diagnosis Date Noted   Premature rupture of membranes 03/15/2021   Anemia affecting pregnancy 01/28/2021   UTI due to extended-spectrum beta lactamase (ESBL) producing Escherichia coli 01/20/2021   Obesity in pregnancy 01/17/2021   Supervision of high risk pregnancy, antepartum 01/11/2021   Chronic hypertension affecting pregnancy 01/11/2021    PLAN: MFM consult to  discuss prognosis and plan  Scheryl Darter 03/16/2021,6:35 AM

## 2021-03-16 NOTE — Telephone Encounter (Addendum)
-----   Message from Venora Maples, MD sent at 03/16/2021  2:29 PM EDT ----- Please call [facility] with updated information needed to produce a result.  Thanks, Dr. Crissie Reese ----- Message ----- From: Interface, [facility] Lab Results In Sent: 03/16/2021  12:36 PM EDT To: Venora Maples, MD   Called [facility] to provide gestational age on date of lab draw. Test should result in the next 10-15 minutes.

## 2021-03-24 ENCOUNTER — Ambulatory Visit: Payer: Medicaid Other | Admitting: *Deleted

## 2021-03-24 ENCOUNTER — Ambulatory Visit (INDEPENDENT_AMBULATORY_CARE_PROVIDER_SITE_OTHER): Payer: Medicaid Other | Admitting: Obstetrics and Gynecology

## 2021-03-24 ENCOUNTER — Other Ambulatory Visit: Payer: Self-pay

## 2021-03-24 ENCOUNTER — Ambulatory Visit (HOSPITAL_BASED_OUTPATIENT_CLINIC_OR_DEPARTMENT_OTHER): Payer: Medicaid Other

## 2021-03-24 ENCOUNTER — Encounter: Payer: Self-pay | Admitting: *Deleted

## 2021-03-24 VITALS — BP 147/85 | HR 88

## 2021-03-24 VITALS — BP 147/85 | HR 88 | Wt 233.0 lb

## 2021-03-24 DIAGNOSIS — O0990 Supervision of high risk pregnancy, unspecified, unspecified trimester: Secondary | ICD-10-CM

## 2021-03-24 DIAGNOSIS — O10912 Unspecified pre-existing hypertension complicating pregnancy, second trimester: Secondary | ICD-10-CM | POA: Insufficient documentation

## 2021-03-24 DIAGNOSIS — O99210 Obesity complicating pregnancy, unspecified trimester: Secondary | ICD-10-CM | POA: Insufficient documentation

## 2021-03-24 DIAGNOSIS — Z3689 Encounter for other specified antenatal screening: Secondary | ICD-10-CM

## 2021-03-24 DIAGNOSIS — O42912 Preterm premature rupture of membranes, unspecified as to length of time between rupture and onset of labor, second trimester: Secondary | ICD-10-CM | POA: Diagnosis not present

## 2021-03-24 DIAGNOSIS — O10012 Pre-existing essential hypertension complicating pregnancy, second trimester: Secondary | ICD-10-CM | POA: Diagnosis not present

## 2021-03-24 DIAGNOSIS — Z3A21 21 weeks gestation of pregnancy: Secondary | ICD-10-CM

## 2021-03-24 DIAGNOSIS — O26879 Cervical shortening, unspecified trimester: Secondary | ICD-10-CM

## 2021-03-24 DIAGNOSIS — O10919 Unspecified pre-existing hypertension complicating pregnancy, unspecified trimester: Secondary | ICD-10-CM

## 2021-03-24 DIAGNOSIS — O26872 Cervical shortening, second trimester: Secondary | ICD-10-CM

## 2021-03-24 DIAGNOSIS — O09522 Supervision of elderly multigravida, second trimester: Secondary | ICD-10-CM

## 2021-03-24 LAB — CBC WITH DIFFERENTIAL/PLATELET
Basophils Absolute: 0 10*3/uL (ref 0.0–0.2)
Basos: 0 %
EOS (ABSOLUTE): 0 10*3/uL (ref 0.0–0.4)
Eos: 0 %
Hematocrit: 35 % (ref 34.0–46.6)
Hemoglobin: 12.4 g/dL (ref 11.1–15.9)
Immature Grans (Abs): 0 10*3/uL (ref 0.0–0.1)
Immature Granulocytes: 0 %
Lymphocytes Absolute: 2.2 10*3/uL (ref 0.7–3.1)
Lymphs: 18 %
MCH: 28.2 pg (ref 26.6–33.0)
MCHC: 35.4 g/dL (ref 31.5–35.7)
MCV: 80 fL (ref 79–97)
Monocytes Absolute: 1.3 10*3/uL — ABNORMAL HIGH (ref 0.1–0.9)
Monocytes: 11 %
Neutrophils Absolute: 8.7 10*3/uL — ABNORMAL HIGH (ref 1.4–7.0)
Neutrophils: 71 %
Platelets: 341 10*3/uL (ref 150–450)
RBC: 4.4 x10E6/uL (ref 3.77–5.28)
RDW: 17.5 % — ABNORMAL HIGH (ref 11.7–15.4)
WBC: 12.3 10*3/uL — ABNORMAL HIGH (ref 3.4–10.8)

## 2021-03-24 NOTE — Progress Notes (Signed)
   PRENATAL VISIT NOTE  Subjective:  Suzanne Lambert is a 35 y.o. G2P0010 at [redacted]w[redacted]d being seen today for ongoing prenatal care.  She is currently monitored for the following issues for this high-risk pregnancy and has Supervision of high risk pregnancy, antepartum; Chronic hypertension affecting pregnancy; Obesity in pregnancy; UTI due to extended-spectrum beta lactamase (ESBL) producing Escherichia coli; Anemia affecting pregnancy; Premature rupture of membranes; and [redacted] weeks gestation of pregnancy on their problem list.  Patient doing well with no acute concerns today. She reports no complaints.  Contractions: Not present. Vag. Bleeding: None.  Movement: Present. Pt notes she still has a small amount of leaking fluid.   Pt just had scan with MFM, read not performed yet, but images were viewed.  CL appears to be around 3 cm.  Max vertical pocket is 1.4-1.9 cm.  Pt denies any fever, chills or abdominal pain.  The following portions of the patient's history were reviewed and updated as appropriate: allergies, current medications, past family history, past medical history, past social history, past surgical history and problem list. Problem list updated.  Objective:   Vitals:   03/24/21 0921  BP: (!) 147/85  Pulse: 88  Weight: 233 lb (105.7 kg)    Fetal Status: Fetal Heart Rate (bpm): 159 Fundal Height: 22 cm Movement: Present     General:  Alert, oriented and cooperative. Patient is in no acute distress.  Skin: Skin is warm and dry. No rash noted.   Cardiovascular: Normal heart rate noted  Respiratory: Normal respiratory effort, no problems with respiration noted  Abdomen: Soft, gravid, appropriate for gestational age.  Pain/Pressure: Absent     Pelvic: Cervical exam deferred        Extremities: Normal range of motion.  Edema: None  Mental Status:  Normal mood and affect. Normal behavior. Normal judgment and thought content.   Assessment and Plan:  Pregnancy: G2P0010 at [redacted]w[redacted]d  1. [redacted]  weeks gestation of pregnancy   2. Chronic hypertension affecting pregnancy Pt continues with procardia, BP still mildly eleavated   3. Supervision of high risk pregnancy, antepartum One more weekly visit and then if stable admit to inpatient care at 23 weeks per MFM  4. Premature rupture of membranes in second trimester, unspecified duration to onset of labor Admit at 23 weeks for abx and BMZ if still stable - CBC w/Diff  Preterm labor symptoms and general obstetric precautions including but not limited to vaginal bleeding, contractions, leaking of fluid and fetal movement were reviewed in detail with the patient.  Please refer to After Visit Summary for other counseling recommendations.   Return in about 1 week (around 03/31/2021) for [facility], in person.   Mariel Aloe, MD Faculty Attending Center for [facility]

## 2021-03-25 ENCOUNTER — Inpatient Hospital Stay (HOSPITAL_COMMUNITY)
Admission: AD | Admit: 2021-03-25 | Discharge: 2021-03-28 | DRG: 769 | Disposition: A | Discharge status: Home / Self Care | Payer: Medicaid Other | Attending: Obstetrics and Gynecology | Admitting: Obstetrics and Gynecology

## 2021-03-25 DIAGNOSIS — Z20822 Contact with and (suspected) exposure to covid-19: Secondary | ICD-10-CM | POA: Diagnosis present

## 2021-03-25 DIAGNOSIS — Z7982 Long term (current) use of aspirin: Secondary | ICD-10-CM | POA: Diagnosis not present

## 2021-03-25 DIAGNOSIS — Z3480 Encounter for supervision of other normal pregnancy, unspecified trimester: Secondary | ICD-10-CM

## 2021-03-25 DIAGNOSIS — O42912 Preterm premature rupture of membranes, unspecified as to length of time between rupture and onset of labor, second trimester: Secondary | ICD-10-CM | POA: Diagnosis present

## 2021-03-25 DIAGNOSIS — O1002 Pre-existing essential hypertension complicating childbirth: Secondary | ICD-10-CM | POA: Diagnosis present

## 2021-03-25 DIAGNOSIS — Z87891 Personal history of nicotine dependence: Secondary | ICD-10-CM

## 2021-03-25 DIAGNOSIS — O09529 Supervision of elderly multigravida, unspecified trimester: Secondary | ICD-10-CM

## 2021-03-25 DIAGNOSIS — O114 Pre-existing hypertension with pre-eclampsia, complicating childbirth: Secondary | ICD-10-CM | POA: Diagnosis present

## 2021-03-25 DIAGNOSIS — Z3A22 22 weeks gestation of pregnancy: Secondary | ICD-10-CM

## 2021-03-25 DIAGNOSIS — O0289 Other abnormal products of conception: Secondary | ICD-10-CM | POA: Diagnosis not present

## 2021-03-25 MED ORDER — SOD CITRATE-CITRIC ACID 500-334 MG/5ML PO SOLN
30.0000 mL | ORAL | Status: DC | PRN
  Administered 2021-03-26: 30 mL via ORAL
  Filled 2021-03-25: qty 30

## 2021-03-25 MED ORDER — LACTATED RINGERS IV SOLN: 500.0000 mL | INTRAVENOUS | Status: DC | PRN

## 2021-03-25 MED ORDER — LABETALOL HCL 5 MG/ML IV SOLN: 40.0000 mg | INTRAVENOUS | Status: DC | PRN

## 2021-03-25 MED ORDER — ACETAMINOPHEN 325 MG PO TABS
650.0000 mg | ORAL_TABLET | ORAL | Status: DC | PRN
Start: 2021-03-25 — End: 2021-03-26

## 2021-03-25 MED ORDER — ONDANSETRON HCL 4 MG/2ML IJ SOLN: 4.0000 mg | Freq: Four times a day (QID) | INTRAMUSCULAR | Status: DC | PRN

## 2021-03-25 MED ORDER — OXYCODONE-ACETAMINOPHEN 5-325 MG PO TABS: 1.0000 | ORAL_TABLET | ORAL | Status: DC | PRN

## 2021-03-25 MED ORDER — OXYCODONE-ACETAMINOPHEN 5-325 MG PO TABS: 2.0000 | ORAL_TABLET | ORAL | Status: DC | PRN

## 2021-03-25 MED ORDER — LIDOCAINE HCL (PF) 1 % IJ SOLN
30.0000 mL | INTRAMUSCULAR | Status: DC | PRN
Start: 2021-03-25 — End: 2021-03-26

## 2021-03-25 MED ORDER — LABETALOL HCL 5 MG/ML IV SOLN: 80.0000 mg | INTRAVENOUS | Status: DC | PRN

## 2021-03-25 MED ORDER — MAGNESIUM SULFATE 40 GM/1000ML IV SOLN
2.0000 g/h | INTRAVENOUS | Status: AC
  Administered 2021-03-26: 2 g/h via INTRAVENOUS
  Filled 2021-03-25 (×2): qty 1000

## 2021-03-25 MED ORDER — MAGNESIUM SULFATE BOLUS VIA INFUSION
6.0000 g | Freq: Once | INTRAVENOUS | Status: AC
  Administered 2021-03-25: 6 g via INTRAVENOUS
  Filled 2021-03-25: qty 1000

## 2021-03-25 MED ORDER — OXYTOCIN-SODIUM CHLORIDE 30-0.9 UT/500ML-% IV SOLN
2.5000 [IU]/h | INTRAVENOUS | Status: DC
  Filled 2021-03-25: qty 500

## 2021-03-25 MED ORDER — OXYTOCIN BOLUS FROM INFUSION: 333.0000 mL | Freq: Once | INTRAVENOUS | Status: DC

## 2021-03-25 MED ORDER — LACTATED RINGERS IV SOLN: INTRAVENOUS | Status: DC

## 2021-03-25 MED ORDER — HYDRALAZINE HCL 20 MG/ML IJ SOLN: 10.0000 mg | INTRAMUSCULAR | Status: DC | PRN

## 2021-03-25 MED ORDER — LABETALOL HCL 5 MG/ML IV SOLN: 20.0000 mg | INTRAVENOUS | Status: DC | PRN

## 2021-03-25 MED ORDER — LABETALOL HCL 200 MG PO TABS
200.0000 mg | ORAL_TABLET | Freq: Two times a day (BID) | ORAL | Status: DC
  Administered 2021-03-25: 200 mg via ORAL
  Filled 2021-03-25: qty 1

## 2021-03-26 ENCOUNTER — Inpatient Hospital Stay (HOSPITAL_COMMUNITY): Payer: Medicaid Other | Admitting: Certified Registered"

## 2021-03-26 ENCOUNTER — Encounter (HOSPITAL_COMMUNITY): Payer: Self-pay | Admitting: Obstetrics and Gynecology

## 2021-03-26 ENCOUNTER — Encounter (HOSPITAL_COMMUNITY)
Admission: AD | Disposition: A | Discharge status: Home / Self Care | Payer: Self-pay | Source: Home / Self Care | Attending: Obstetrics and Gynecology

## 2021-03-26 DIAGNOSIS — O0289 Other abnormal products of conception: Secondary | ICD-10-CM

## 2021-03-26 HISTORY — PX: DILATION AND CURETTAGE OF UTERUS: SHX78

## 2021-03-26 LAB — COMPREHENSIVE METABOLIC PANEL
ALT: 9 U/L (ref 0–44)
ALT: 10 U/L (ref 0–44)
AST: 25 U/L (ref 15–41)
AST: 16 U/L (ref 15–41)
Albumin: 2.9 g/dL — ABNORMAL LOW (ref 3.5–5.0)
Albumin: 2.6 g/dL — ABNORMAL LOW (ref 3.5–5.0)
Alkaline Phosphatase: 69 U/L (ref 38–126)
Alkaline Phosphatase: 46 U/L (ref 38–126)
Anion gap: 14 (ref 5–15)
Anion gap: 12 (ref 5–15)
BUN: 5 mg/dL — ABNORMAL LOW (ref 6–20)
BUN: 5 mg/dL — ABNORMAL LOW (ref 6–20)
CO2: 17 mmol/L — ABNORMAL LOW (ref 22–32)
CO2: 18 mmol/L — ABNORMAL LOW (ref 22–32)
Calcium: 9.8 mg/dL (ref 8.9–10.3)
Calcium: 7.8 mg/dL — ABNORMAL LOW (ref 8.9–10.3)
Chloride: 102 mmol/L (ref 98–111)
Chloride: 101 mmol/L (ref 98–111)
Creatinine, Ser: 0.53 mg/dL (ref 0.44–1.00)
Creatinine, Ser: 0.62 mg/dL (ref 0.44–1.00)
GFR, Estimated: >60 mL/min (ref >60)
GFR, Estimated: >60 mL/min (ref >60)
Glucose, Bld: 92 mg/dL (ref 70–99)
Glucose, Bld: 215 mg/dL — ABNORMAL HIGH (ref 70–99)
Potassium: 3.8 mmol/L (ref 3.5–5.1)
Potassium: 3.8 mmol/L (ref 3.5–5.1)
Sodium: 133 mmol/L — ABNORMAL LOW (ref 135–145)
Sodium: 131 mmol/L — ABNORMAL LOW (ref 135–145)
Total Bilirubin: 0.9 mg/dL (ref 0.3–1.2)
Total Bilirubin: 0.4 mg/dL (ref 0.3–1.2)
Total Protein: 6.8 g/dL (ref 6.5–8.1)
Total Protein: 5.6 g/dL — ABNORMAL LOW (ref 6.5–8.1)

## 2021-03-26 LAB — RESP PANEL BY RT-PCR (FLU A&B, COVID) ARPGX2
Influenza A by PCR: NEGATIVE
Influenza B by PCR: NEGATIVE
SARS Coronavirus 2 by RT PCR: NEGATIVE

## 2021-03-26 LAB — DIC (DISSEMINATED INTRAVASCULAR COAGULATION)PANEL
D-Dimer, Quant: >20 ug/mL-FEU — ABNORMAL HIGH (ref 0.00–0.50)
Fibrinogen: 486 mg/dL — ABNORMAL HIGH (ref 210–475)
INR: 1.2 (ref 0.8–1.2)
Platelets: 290 10*3/uL (ref 150–400)
Prothrombin Time: 14.7 seconds (ref 11.4–15.2)
Smear Review: NONE SEEN
aPTT: 30 seconds (ref 24–36)

## 2021-03-26 LAB — CBC
HCT: 24.5 % — ABNORMAL LOW (ref 36.0–46.0)
HCT: 35.7 % — ABNORMAL LOW (ref 36.0–46.0)
Hemoglobin: 12.2 g/dL (ref 12.0–15.0)
Hemoglobin: 8.4 g/dL — ABNORMAL LOW (ref 12.0–15.0)
MCH: 28 pg (ref 26.0–34.0)
MCH: 28.3 pg (ref 26.0–34.0)
MCHC: 34.2 g/dL (ref 30.0–36.0)
MCHC: 34.3 g/dL (ref 30.0–36.0)
MCV: 82.1 fL (ref 80.0–100.0)
MCV: 82.5 fL (ref 80.0–100.0)
Platelets: 302 10*3/uL (ref 150–400)
Platelets: 386 10*3/uL (ref 150–400)
RBC: 2.97 MIL/uL — ABNORMAL LOW (ref 3.87–5.11)
RBC: 4.35 MIL/uL (ref 3.87–5.11)
RDW: 17.6 % — ABNORMAL HIGH (ref 11.5–15.5)
RDW: 17.8 % — ABNORMAL HIGH (ref 11.5–15.5)
WBC: 21 10*3/uL — ABNORMAL HIGH (ref 4.0–10.5)
WBC: 21.7 10*3/uL — ABNORMAL HIGH (ref 4.0–10.5)
nRBC: 0 % (ref 0.0–0.2)
nRBC: 0 % (ref 0.0–0.2)

## 2021-03-26 LAB — HEMOGLOBIN AND HEMATOCRIT, BLOOD
HCT: 28.5 % — ABNORMAL LOW (ref 36.0–46.0)
Hemoglobin: 9.8 g/dL — ABNORMAL LOW (ref 12.0–15.0)

## 2021-03-26 LAB — TYPE AND SCREEN
ABO/RH(D): O POS
Antibody Screen: NEGATIVE

## 2021-03-26 LAB — RPR: RPR Ser Ql: NONREACTIVE

## 2021-03-26 LAB — MASSIVE TRANSFUSION PROTOCOL ORDER (BLOOD BANK NOTIFICATION)

## 2021-03-26 SURGERY — DILATION AND CURETTAGE
Anesthesia: General | Wound class: Clean Contaminated

## 2021-03-26 MED ORDER — PROPOFOL 10 MG/ML IV BOLUS
INTRAVENOUS | Status: DC | PRN
  Administered 2021-03-26: 200 mg via INTRAVENOUS

## 2021-03-26 MED ORDER — FENTANYL CITRATE (PF) 250 MCG/5ML IJ SOLN
INTRAMUSCULAR | Status: AC
  Filled 2021-03-26: qty 5

## 2021-03-26 MED ORDER — DEXAMETHASONE SODIUM PHOSPHATE 10 MG/ML IJ SOLN
INTRAMUSCULAR | Status: DC | PRN
  Administered 2021-03-26: 10 mg via INTRAVENOUS

## 2021-03-26 MED ORDER — KETOROLAC TROMETHAMINE 30 MG/ML IJ SOLN: 30.0000 mg | Freq: Four times a day (QID) | INTRAMUSCULAR | Status: AC | PRN

## 2021-03-26 MED ORDER — DIPHENHYDRAMINE HCL 50 MG/ML IJ SOLN: 12.5000 mg | INTRAMUSCULAR | Status: DC | PRN

## 2021-03-26 MED ORDER — SCOPOLAMINE 1 MG/3DAYS TD PT72
MEDICATED_PATCH | TRANSDERMAL | Status: AC
  Filled 2021-03-26: qty 1

## 2021-03-26 MED ORDER — NALBUPHINE HCL 10 MG/ML IJ SOLN: 5.0000 mg | INTRAMUSCULAR | Status: DC | PRN

## 2021-03-26 MED ORDER — FENTANYL CITRATE (PF) 100 MCG/2ML IJ SOLN
INTRAMUSCULAR | Status: DC | PRN
  Administered 2021-03-26 (×2): 50 ug via INTRAVENOUS
  Administered 2021-03-26: 100 ug via INTRAVENOUS

## 2021-03-26 MED ORDER — SODIUM CHLORIDE 0.9 % IR SOLN
Status: DC | PRN
  Administered 2021-03-26: 1000 mL

## 2021-03-26 MED ORDER — TRANEXAMIC ACID-NACL 1000-0.7 MG/100ML-% IV SOLN
INTRAVENOUS | Status: DC | PRN
  Administered 2021-03-26: 1000 mg via INTRAVENOUS

## 2021-03-26 MED ORDER — BENZOCAINE-MENTHOL 20-0.5 % EX AERO: 1.0000 "application " | INHALATION_SPRAY | CUTANEOUS | Status: DC | PRN

## 2021-03-26 MED ORDER — SCOPOLAMINE 1 MG/3DAYS TD PT72
MEDICATED_PATCH | TRANSDERMAL | Status: DC | PRN
  Administered 2021-03-26: 1 via TRANSDERMAL

## 2021-03-26 MED ORDER — FERROUS SULFATE 325 (65 FE) MG PO TABS
325.0000 mg | ORAL_TABLET | ORAL | Status: DC
  Administered 2021-03-26: 325 mg via ORAL
  Filled 2021-03-26: qty 1

## 2021-03-26 MED ORDER — CEFAZOLIN SODIUM-DEXTROSE 2-4 GM/100ML-% IV SOLN
2.0000 g | Freq: Three times a day (TID) | INTRAVENOUS | Status: AC
  Administered 2021-03-26 – 2021-03-27 (×3): 2 g via INTRAVENOUS
  Filled 2021-03-26 (×6): qty 100

## 2021-03-26 MED ORDER — HYDROMORPHONE HCL 1 MG/ML IJ SOLN
1.0000 mg | Freq: Once | INTRAMUSCULAR | Status: AC
Start: 2021-03-26 — End: 2021-03-26

## 2021-03-26 MED ORDER — ONDANSETRON HCL 4 MG/2ML IJ SOLN
INTRAMUSCULAR | Status: AC
  Filled 2021-03-26: qty 2

## 2021-03-26 MED ORDER — OXYCODONE HCL 5 MG PO TABS
5.0000 mg | ORAL_TABLET | ORAL | Status: DC | PRN
Start: 2021-03-26 — End: 2021-03-28
  Administered 2021-03-27 – 2021-03-28 (×4): 5 mg via ORAL
  Filled 2021-03-26 (×4): qty 1

## 2021-03-26 MED ORDER — LACTATED RINGERS IV SOLN: INTRAVENOUS | Status: DC | PRN

## 2021-03-26 MED ORDER — LACTATED RINGERS IV SOLN: INTRAVENOUS | Status: DC

## 2021-03-26 MED ORDER — MIDAZOLAM HCL 2 MG/2ML IJ SOLN
INTRAMUSCULAR | Status: AC
  Filled 2021-03-26: qty 2

## 2021-03-26 MED ORDER — EPHEDRINE SULFATE 50 MG/ML IJ SOLN
INTRAMUSCULAR | Status: DC | PRN
  Administered 2021-03-26: 10 mg via INTRAVENOUS

## 2021-03-26 MED ORDER — ONDANSETRON HCL 4 MG/2ML IJ SOLN: 4.0000 mg | INTRAMUSCULAR | Status: DC | PRN

## 2021-03-26 MED ORDER — NALOXONE HCL 4 MG/10ML IJ SOLN
1.0000 ug/kg/h | INTRAVENOUS | Status: DC | PRN
  Filled 2021-03-26: qty 5

## 2021-03-26 MED ORDER — OXYCODONE HCL 5 MG PO TABS: 10.0000 mg | ORAL_TABLET | ORAL | Status: DC | PRN

## 2021-03-26 MED ORDER — DEXAMETHASONE SODIUM PHOSPHATE 10 MG/ML IJ SOLN
INTRAMUSCULAR | Status: AC
  Filled 2021-03-26: qty 1

## 2021-03-26 MED ORDER — MIDAZOLAM HCL 2 MG/2ML IJ SOLN
INTRAMUSCULAR | Status: DC | PRN
  Administered 2021-03-26: 2 mg via INTRAVENOUS

## 2021-03-26 MED ORDER — NALBUPHINE HCL 10 MG/ML IJ SOLN
5.0000 mg | Freq: Once | INTRAMUSCULAR | Status: DC | PRN
Start: 2021-03-26 — End: 2021-03-28

## 2021-03-26 MED ORDER — WITCH HAZEL-GLYCERIN EX PADS: 1.0000 "application " | MEDICATED_PAD | CUTANEOUS | Status: DC | PRN

## 2021-03-26 MED ORDER — IBUPROFEN 600 MG PO TABS
600.0000 mg | ORAL_TABLET | Freq: Four times a day (QID) | ORAL | Status: DC
  Administered 2021-03-26 – 2021-03-28 (×7): 600 mg via ORAL
  Filled 2021-03-26 (×7): qty 1

## 2021-03-26 MED ORDER — ONDANSETRON HCL 4 MG PO TABS: 4.0000 mg | ORAL_TABLET | ORAL | Status: DC | PRN

## 2021-03-26 MED ORDER — HYDROMORPHONE HCL 1 MG/ML IJ SOLN
INTRAMUSCULAR | Status: AC
  Administered 2021-03-26: 1 mg
  Filled 2021-03-26: qty 1

## 2021-03-26 MED ORDER — ZOLPIDEM TARTRATE 5 MG PO TABS
5.0000 mg | ORAL_TABLET | Freq: Every evening | ORAL | Status: DC | PRN
  Administered 2021-03-26: 5 mg via ORAL
  Filled 2021-03-26: qty 1

## 2021-03-26 MED ORDER — PHENYLEPHRINE HCL (PRESSORS) 10 MG/ML IV SOLN
INTRAVENOUS | Status: DC | PRN
  Administered 2021-03-26: 200 ug via INTRAVENOUS
  Administered 2021-03-26: 160 ug via INTRAVENOUS
  Administered 2021-03-26: 80 ug via INTRAVENOUS
  Administered 2021-03-26: 200 ug via INTRAVENOUS

## 2021-03-26 MED ORDER — PROPOFOL 10 MG/ML IV BOLUS
INTRAVENOUS | Status: AC
  Filled 2021-03-26: qty 20

## 2021-03-26 MED ORDER — HYDROMORPHONE HCL 1 MG/ML PO LIQD
1.0000 mg | Freq: Once | ORAL | Status: DC
  Filled 2021-03-26: qty 1

## 2021-03-26 MED ORDER — DIBUCAINE (PERIANAL) 1 % EX OINT: 1.0000 "application " | TOPICAL_OINTMENT | CUTANEOUS | Status: DC | PRN

## 2021-03-26 MED ORDER — CEFAZOLIN SODIUM-DEXTROSE 2-4 GM/100ML-% IV SOLN: 2.0000 g | INTRAVENOUS | Status: DC

## 2021-03-26 MED ORDER — COCONUT OIL OIL: 1.0000 "application " | TOPICAL_OIL | Status: DC | PRN

## 2021-03-26 MED ORDER — LIDOCAINE 2% (20 MG/ML) 5 ML SYRINGE
INTRAMUSCULAR | Status: AC
  Filled 2021-03-26: qty 5

## 2021-03-26 MED ORDER — SUCCINYLCHOLINE CHLORIDE 200 MG/10ML IV SOSY
PREFILLED_SYRINGE | INTRAVENOUS | Status: AC
  Filled 2021-03-26: qty 10

## 2021-03-26 MED ORDER — ACETAMINOPHEN 10 MG/ML IV SOLN
INTRAVENOUS | Status: AC
  Filled 2021-03-26: qty 100

## 2021-03-26 MED ORDER — DIPHENHYDRAMINE HCL 25 MG PO CAPS: 25.0000 mg | ORAL_CAPSULE | Freq: Four times a day (QID) | ORAL | Status: DC | PRN

## 2021-03-26 MED ORDER — FENTANYL CITRATE (PF) 100 MCG/2ML IJ SOLN: 25.0000 ug | INTRAMUSCULAR | Status: DC | PRN

## 2021-03-26 MED ORDER — ONDANSETRON HCL 4 MG/2ML IJ SOLN: 4.0000 mg | Freq: Three times a day (TID) | INTRAMUSCULAR | Status: DC | PRN

## 2021-03-26 MED ORDER — SCOPOLAMINE 1 MG/3DAYS TD PT72: 1.0000 | MEDICATED_PATCH | Freq: Once | TRANSDERMAL | Status: DC

## 2021-03-26 MED ORDER — PHENYLEPHRINE 8 MG IN D5W 100 ML (0.08MG/ML) PREMIX OPTIME
INJECTION | INTRAVENOUS | Status: DC | PRN
  Administered 2021-03-26: 60 ug/min via INTRAVENOUS

## 2021-03-26 MED ORDER — SUCCINYLCHOLINE CHLORIDE 200 MG/10ML IV SOSY
PREFILLED_SYRINGE | INTRAVENOUS | Status: DC | PRN
  Administered 2021-03-26: 200 mg via INTRAVENOUS

## 2021-03-26 MED ORDER — LIDOCAINE HCL (CARDIAC) PF 100 MG/5ML IV SOSY
PREFILLED_SYRINGE | INTRAVENOUS | Status: DC | PRN
  Administered 2021-03-26: 80 mg via INTRAVENOUS

## 2021-03-26 MED ORDER — DIPHENHYDRAMINE HCL 25 MG PO CAPS: 25.0000 mg | ORAL_CAPSULE | ORAL | Status: DC | PRN

## 2021-03-26 MED ORDER — NALOXONE HCL 0.4 MG/ML IJ SOLN: 0.4000 mg | INTRAMUSCULAR | Status: DC | PRN

## 2021-03-26 MED ORDER — CEFAZOLIN SODIUM-DEXTROSE 2-3 GM-%(50ML) IV SOLR
INTRAVENOUS | Status: DC | PRN
  Administered 2021-03-26: 2 g via INTRAVENOUS

## 2021-03-26 MED ORDER — ACETAMINOPHEN 500 MG PO TABS
1000.0000 mg | ORAL_TABLET | Freq: Four times a day (QID) | ORAL | Status: AC
  Administered 2021-03-26 (×3): 1000 mg via ORAL
  Filled 2021-03-26 (×3): qty 2

## 2021-03-26 MED ORDER — SODIUM CHLORIDE 0.9% FLUSH: 3.0000 mL | INTRAVENOUS | Status: DC | PRN

## 2021-03-26 MED ORDER — ALBUMIN HUMAN 5 % IV SOLN: INTRAVENOUS | Status: DC | PRN

## 2021-03-26 MED ORDER — PHENYLEPHRINE HCL-NACL 20-0.9 MG/250ML-% IV SOLN
INTRAVENOUS | Status: AC
  Filled 2021-03-26: qty 250

## 2021-03-26 MED ORDER — ACETAMINOPHEN 10 MG/ML IV SOLN
INTRAVENOUS | Status: DC | PRN
  Administered 2021-03-26: 1000 mg via INTRAVENOUS

## 2021-03-26 MED ORDER — NALBUPHINE HCL 10 MG/ML IJ SOLN
5.0000 mg | INTRAMUSCULAR | Status: DC | PRN
Start: 2021-03-26 — End: 2021-03-28

## 2021-03-26 MED ORDER — SIMETHICONE 80 MG PO CHEW: 80.0000 mg | CHEWABLE_TABLET | ORAL | Status: DC | PRN

## 2021-03-26 SURGICAL SUPPLY — 12 items
CLOTH BEACON ORANGE TIMEOUT ST (SAFETY) ×1 IMPLANT
GLOVE BIO SURGEON STRL SZ7 (GLOVE) ×4 IMPLANT
GOWN STRL REUS W/TWL LRG LVL3 (GOWN DISPOSABLE) ×2 IMPLANT
KIT BERKELEY 1ST TRIMESTER 3/8 (MISCELLANEOUS) ×2 IMPLANT
NS IRRIG 1000ML POUR BTL (IV SOLUTION) ×2 IMPLANT
PACK VAGINAL MINOR WOMEN LF (CUSTOM PROCEDURE TRAY) ×2 IMPLANT
PAD OB MATERNITY 4.3X12.25 (PERSONAL CARE ITEMS) ×1 IMPLANT
PAD PREP 24X48 CUFFED NSTRL (MISCELLANEOUS) ×2 IMPLANT
SET BERKELEY SUCTION TUBING (SUCTIONS) ×2 IMPLANT
TOWEL OR 17X24 6PK STRL BLUE (TOWEL DISPOSABLE) ×2 IMPLANT
TRAY FOLEY W/BAG SLVR 14FR LF (SET/KITS/TRAYS/PACK) ×1 IMPLANT
VACURETTE 12 RIGID CVD (CANNULA) ×4 IMPLANT

## 2021-03-26 NOTE — Transfer of Care (Signed)
Immediate Anesthesia Transfer of Care Note  Patient: Suzanne Lambert  Procedure(s) Performed: DILATATION AND CURETTAGE  Patient Location: PACU  Anesthesia Type:General  Level of Consciousness: awake, alert  and oriented  Airway & Oxygen Therapy: Patient Spontanous Breathing and Patient connected to nasal cannula oxygen  Post-op Assessment: Report given to RN and Post -op Vital signs reviewed and stable  Post vital signs: Reviewed and stable  Last Vitals:  Vitals Value Taken Time  BP 106/88   Temp 36.4 C 03/26/21 0240  Pulse 94   Resp 18   SpO2 100%     Last Pain:  Vitals:   03/26/21 0240  TempSrc: Axillary  PainSc: 0-No pain         Complications: No notable events documented.

## 2021-03-26 NOTE — H&P (Signed)
OBSTETRIC ADMISSION HISTORY AND PHYSICAL  Suzanne Lambert is a 35 y.o. female G2P0010 with IUP at [redacted]w[redacted]d by LMP presenting for preterm previable delivery at home after previable premature rupture of membranes on 9/6. Patient was admitted to [facility] specialty service from 03/15/2021 to 03/16/2021 and desired expectant management and was discharged home with return precautions. Of note, patient was diagnosed with shortened cervix during this treatment prior to the PPROM and was getting treatment with vaginal progesterone.  Patient reports she started having cramping earlier today and then delivered around 9PM. Baby was taken to NICU and is now deceased. Reports did not have a lot of bleeding.  Denies headaches and vision changes  She [facility] her [facility] at  [facility]    Dating: By LMP --->  Estimated Date of Delivery: 07/30/21    Past Medical History: Past Medical History:  Diagnosis Date   Hypertension    Vaginal Pap smear, abnormal 2011   [facility]    Past Surgical History: Past Surgical History:  Procedure Laterality Date   MOUTH SURGERY  2020    Obstetrical History: OB History     Gravida  2   Para  0   Term  0   Preterm  0   AB  1   Living  0      SAB  0   IAB  1   Ectopic  0   Multiple  0   Live Births  0           Social History Social History   Socioeconomic History   Marital status: Single    Spouse name: Not on file   Number of children: Not on file   Years of education: Not on file   Highest education level: Not on file  Occupational History   Not on file  Tobacco Use   Smoking status: Former    Packs/day: 0.50    Years: 15.00    Pack years: 7.50    Types: Cigarettes    Quit date: 06/08/2020    Years since quitting: 0.7   Smokeless tobacco: Never  Vaping Use   Vaping Use: Never used  Substance and Sexual Activity   Alcohol use: Not Currently    Comment: social   Drug use: Not Currently    Types: Marijuana    Comment: last used 05/2020    Sexual activity: Yes    Birth control/protection: None  Other Topics Concern   Not on file  Social History Narrative   Not on file   Social Determinants of Health   Financial Resource Strain: Not on file  Food Insecurity: No Food Insecurity   Worried About Running Out of Food in the Last Year: Never true   Ran Out of Food in the Last Year: Never true  Transportation Needs: No Transportation Needs   Lack of Transportation (Medical): No   Lack of Transportation (Non-Medical): No  Physical Activity: Not on file  Stress: Not on file  Social Connections: Not on file    Family History: Family History  Problem Relation Age of Onset   Diabetes Mother    Hypertension Mother    Stroke Father    Hypertension Father    Heart disease Brother    Stroke Brother     Allergies: No Known Allergies  Medications Prior to Admission  Medication Sig Dispense Refill Last Dose   acetaminophen (TYLENOL) 325 MG tablet Take 2 tablets (650 mg total) by mouth every 4 (four) hours  as needed (for pain scale < 4  OR  temperature  >/=  100.5 F). (Patient not taking: Reported on 03/24/2021)      aspirin 81 MG chewable tablet Chew 1 tablet (81 mg total) by mouth daily. 90 tablet 3    ferrous fumarate (HEMOCYTE - 106 MG FE) 325 (106 Fe) MG TABS tablet Take 1 tablet (106 mg of iron total) by mouth every other day. 30 tablet 2    NIFEdipine (PROCARDIA XL) 60 MG 24 hr tablet Take 1 tablet (60 mg total) by mouth daily. 90 tablet 1    Prenatal Vit-Fe Fumarate-FA (MULTIVITAMIN-PRENATAL) 27-0.8 MG TABS tablet Take 1 tablet by mouth daily at 12 noon. 30 tablet 12      Review of Systems   All systems reviewed and negative except as stated in HPI  Blood pressure 140/87, pulse 97, temperature 98.3 F (36.8 C), temperature source Oral, resp. rate 16, last menstrual period 10/23/2020. General appearance: alert and tearful, NOT ill appearing Lungs: clear to auscultation bilaterally Heart: regular rate and  rhythm Abdomen: soft, non-tender; bowel sounds normal Extremities: Homans sign is negative, no sign of DVT      Prenatal labs: ABO, Rh: --/--/PENDING (09/16 2335) Antibody: PENDING (09/16 2335) Rubella: 3.99 (07/12 1112) RPR: Non Reactive (07/12 1112)  HBsAg: Negative (07/12 1112)  HIV: Non Reactive (07/12 1112)  GBS:   Unknown 1 hr Glucola N/A  Prenatal Transfer Tool  Maternal Diabetes: No Genetic Screening: Normal Maternal Ultrasounds/Referrals: Other:Last [place] after PPROM with 2 cm of amniotic fluid  Fetal Ultrasounds or other Referrals:  None Maternal Substance Abuse:  No Significant Maternal Medications:  Meds include: Other: Procardia Significant Maternal Lab Results: None  Results for orders placed or performed during the hospital encounter of 03/25/21 (from the past 24 hour(s))  Type and screen [facility]   Collection Time: 03/25/21 11:35 PM  Result Value Ref Range   ABO/RH(D) PENDING    Antibody Screen PENDING    Sample Expiration      03/28/2021,2359 Performed at [facility], [address], [place], [place] [address]     Patient Active Problem List   Diagnosis Date Noted   Supervision of other normal pregnancy, antepartum 03/25/2021   AMA (advanced maternal age) multigravida 35+ 03/25/2021   [redacted] weeks gestation of pregnancy 03/24/2021   Premature rupture of membranes 03/15/2021   Anemia affecting pregnancy 01/28/2021   UTI due to extended-spectrum beta lactamase (ESBL) producing Escherichia coli 01/20/2021   Obesity in pregnancy 01/17/2021   Supervision of high risk pregnancy, antepartum 01/11/2021   Chronic hypertension affecting pregnancy 01/11/2021    Assessment/Plan:  Suzanne Lambert is a 35 y.o. G2P0010 at [redacted]w[redacted]d here after previable preterm delivery at home after PPROM on 9/6  Previable preterm delivery Not having bleeding. Attempted to deliver placenta in L&D room. Unable to. Will plan for D&C in OR - Ancef for  prophylaxis - admission labs ordered  2. Pre-eclampsia Admission BP 170s/100s. Patient took her procardia this morning. Did not have IV yet so received PO Labetalol. Once IV placed  IV Mg started. Also written for PRN IV anti-hypertensives. - follow up preE labs   Warner Mccreedy, MD  03/26/2021, 12:00 AM

## 2021-03-26 NOTE — MAU Provider Note (Signed)
   None     S Ms. Suzanne Lambert is a 35 y.o. G2P0010 patient who presents to MAU today after home delivery of non-viable infant around 9pm.  Upon arrival patient alert, w/o distress, and bleeding small.  EMTs reports placenta remains in utero.  Infant arrived prior to patient and under care of NICU team.   O BP 123/80   Pulse 91   Temp 98.3 F (36.8 C) (Oral)   Resp 16   LMP 10/23/2020   SpO2 100%  Physical Exam Constitutional:      Appearance: Normal appearance.  HENT:     Head: Normocephalic and atraumatic.  Eyes:     Conjunctiva/sclera: Conjunctivae normal.  Cardiovascular:     Rate and Rhythm: Normal rate.  Pulmonary:     Effort: Pulmonary effort is normal. No respiratory distress.  Musculoskeletal:     Cervical back: Normal range of motion.  Skin:    General: Skin is warm and dry.  Neurological:     Mental Status: She is alert.  Psychiatric:        Mood and Affect: Mood normal.        Behavior: Behavior normal.        Thought Content: Thought content normal.    A Medical screening exam complete   P Admit to L&D for recovery.  Gerrit Heck, CNM 03/26/2021 12:03 AM

## 2021-03-26 NOTE — Brief Op Note (Signed)
03/25/2021 - 03/26/2021  2:22 AM  PATIENT:  Suzanne Lambert  35 y.o. female  PRE-OPERATIVE DIAGNOSIS:  retained products of conception  POST-OPERATIVE DIAGNOSIS:  same. Adherent placenta  PROCEDURE:  Ultrasound guided d&c  SURGEON:  Surgeon(s) and Role:    Gordon Bing, MD - Primary  ASSISTANTS: none   ANESTHESIA:   general  EBL:  in the L&D room and in the OR   Fluids: per anesthesia note  Antibiotics: Ancef 2gm  DRAINS:  indwelling foley UOP    LOCAL MEDICATIONS USED:  NONE  SPECIMEN:  fragmented placenta  DISPOSITION OF SPECIMEN:  PATHOLOGY  COUNTS:  YES  TOURNIQUET:  * No tourniquets in log *  DICTATION: .Note written in EPIC  PLAN OF CARE: Admit to inpatient   PATIENT DISPOSITION:  PACU - hemodynamically stable.   Delay start of Pharmacological VTE agent (>24hrs) due to surgical blood loss or risk of bleeding: n/a  Cornelia Copa MD Attending Center for [facility] (Faculty Practice) 03/26/2021 Time: [phone]

## 2021-03-26 NOTE — Anesthesia Preprocedure Evaluation (Signed)
Anesthesia Evaluation  Patient identified by MRN, date of birth, ID band Patient awake    Reviewed: Allergy & Precautions, NPO status , Patient's Chart, lab work & pertinent test results  Airway Mallampati: II  TM Distance: >3 FB Neck ROM: Full    Dental no notable dental hx. (+) Teeth Intact, Dental Advisory Given   Pulmonary neg pulmonary ROS, former smoker,    Pulmonary exam normal breath sounds clear to auscultation       Cardiovascular hypertension, Pt. on medications Normal cardiovascular exam Rhythm:Regular Rate:Normal     Neuro/Psych negative neurological ROS  negative psych ROS   GI/Hepatic negative GI ROS, Neg liver ROS,   Endo/Other  negative endocrine ROS  Renal/GU negative Renal ROS  negative genitourinary   Musculoskeletal negative musculoskeletal ROS (+)   Abdominal   Peds  Hematology negative hematology ROS (+)   Anesthesia Other Findings G2P0 at [redacted] weeks gestation who delivered at home. Now presents with retained products.   Reproductive/Obstetrics                            Anesthesia Physical Anesthesia Plan  ASA: 2 and emergent  Anesthesia Plan: General   Post-op Pain Management:    Induction: Intravenous and Rapid sequence  PONV Risk Score and Plan: 3 and Treatment may vary due to age or medical condition, Midazolam, Ondansetron and Dexamethasone  Airway Management Planned: Oral ETT  Additional Equipment:   Intra-op Plan:   Post-operative Plan: Extubation in OR  Informed Consent: I have reviewed the patients History and Physical, chart, labs and discussed the procedure including the risks, benefits and alternatives for the proposed anesthesia with the patient or authorized representative who has indicated his/her understanding and acceptance.     Dental advisory given  Plan Discussed with: Anesthesiologist  Anesthesia Plan Comments: (After discussing  spinal vs general anesthesia, pt requested general anesthesia. )        Anesthesia Quick Evaluation

## 2021-03-26 NOTE — Op Note (Signed)
Operative Note   03/26/2021  PRE-OP DIAGNOSIS: Retained products of conception   POST-OP DIAGNOSIS: Same. Densely adherent placenta  SURGEON: Surgeon(s) and Role:    * Dimitrius Steedman, Billey Gosling, MD - Primary  ASSISTANT: Same  PROCEDURE:  Ultrasound guided dilation and curettage  ANESTHESIA: General and paracervical block  ESTIMATED BLOOD LOSS: in the room and in the OR  DRAINS: indwelling foley UOP  TOTAL IV FLUIDS: per anesthesia note  SPECIMENS: products of conception to pathology  VTE PROPHYLAXIS: SCDs to the bilateral lower extremities  ANTIBIOTICS: Ancef 2gm x 1 pre op  COMPLICATIONS: densely adherent placenta  DISPOSITION: PACU - hemodynamically stable.  CONDITION: stable  BLOOD TYPE: O POS  FINDINGS: Exam under anesthesia revealed on ultrasound showed enlarged 14 week ultrasound full of products of conception.  Densely adherent and calcified placenta.  PPH due to atony and difficulty in removing the placenta. On post procedure ultrasound, Uterus now 10 week sized with thin endometrial stripe.      PROCEDURE IN DETAIL:  After informed consent was obtained, the patient was taken to the operating room where anesthesia was obtained without difficulty. The patient was positioned in the dorsal lithotomy position in [place] stirrups. The patient was examined under anesthesia, with the above noted findings.  The bi-valved speculum was placed inside the patient's vagina, and the the anterior lip of the cervix was seen and grasped with the ringed forceps. The suction was then calibrated to and connected to the number 12 cannula.  Under ultrasound guidance, uterine curettage was done yielding minimal products so Ovum Forceps were used to extract the placenta part by part resulting in brisk bleeding until a large piece of placenta was removed. Suction then done and curettage and then suction again with thin stripe noted. Bleeding moderate but stopped with vigorous  bimanual massage and IV Lysteda.  Final speculum exam showed minimal bleeding and thin endometrial blood with no evidence of blood welling up in the uterine cavity.  Excellent hemostasis was noted, and all instruments were removed.  She was then taken out of dorsal lithotomy. The patient tolerated the procedure well.  Sponge, lap and instrument counts were correct x2.  The patient was taken to recovery room in excellent condition.  CBC, fibrinogen and coags ordered and plan for 24 hours of Ancef.   Cornelia Copa MD Attending Center for [employer] [facility])

## 2021-03-26 NOTE — Anesthesia Procedure Notes (Signed)
Procedure Name: Intubation Date/Time: 03/26/2021 1:20 AM Performed by: Jennelle Human, CRNA Pre-anesthesia Checklist: Patient identified, Emergency Drugs available, Suction available and Patient being monitored Patient Re-evaluated:Patient Re-evaluated prior to induction Oxygen Delivery Method: Circle system utilized Preoxygenation: Pre-oxygenation with 100% oxygen Induction Type: IV induction and Rapid sequence Laryngoscope Size: Glidescope and 3 Grade View: Grade I Tube type: Oral Tube size: 7.0 mm Number of attempts: 1 Airway Equipment and Method: Stylet Placement Confirmation: ETT inserted through vocal cords under direct vision, positive ETCO2 and breath sounds checked- equal and bilateral Tube secured with: Tape Dental Injury: Teeth and Oropharynx as per pre-operative assessment

## 2021-03-26 NOTE — Progress Notes (Signed)
L&D Note Cord with clamp hanging out of vagina approximately 4-5cm. Digitally, cervix is 2-3cm and unable to feel cord insertion into placenta. On speculum exam, cervix dilated 1 and dilated. I was unable to tease the placenta down closer to the cervix with either ringed forceps or a sponge but with cord avulsion during attempt.  I d/w pt that I recommend a d&c which she is amenable to. Will give dose of ancef. EBL , bleeding minimal.  Labs not in process and were sitting in tube station. Will resend back to lab  Cornelia Copa MD Attending Center for [employer] (Faculty Practice) 03/26/2021 Time: 1240am

## 2021-03-26 NOTE — Progress Notes (Signed)
Spiritual Care Note  Responded to page to [facility] OR/PACU for emotional support due to pregnancy loss at home and complication of retained placenta. Theresa was appreciative of visit and still quite drowsy, so her sister, an ICU nurse, plans to request chaplain page at a more helpful time.   03/26/21 0300  Clinical Encounter Type  Visited With Patient and family together  Visit Type Post-op (pregnancy loss)  Referral From Nurse  Spiritual Encounters  Spiritual Needs Grief support;Emotional   On-call Chaplain Hillery Aldo, [facility] 24/7 pager: [phone]

## 2021-03-27 NOTE — Anesthesia Postprocedure Evaluation (Signed)
Anesthesia Post Note  Patient: ANURADHA CHABOT  Procedure(s) Performed: DILATATION AND CURETTAGE     Patient location during evaluation: PACU Anesthesia Type: General Level of consciousness: awake and alert Pain management: pain level controlled Vital Signs Assessment: post-procedure vital signs reviewed and stable Respiratory status: spontaneous breathing, nonlabored ventilation, respiratory function stable and patient connected to nasal cannula oxygen Cardiovascular status: blood pressure returned to baseline and stable Postop Assessment: no apparent nausea or vomiting Anesthetic complications: no   No notable events documented.  Last Vitals:  Vitals:   03/27/21 0357 03/27/21 0737  BP: (!) 109/58 (!) 102/52  Pulse: 83 67  Resp: 16 16  Temp: 36.6 C 36.8 C  SpO2: 100% 100%    Last Pain:  Vitals:   03/27/21 0800  TempSrc:   PainSc: 5    Pain Goal: Patients Stated Pain Goal: 0 (03/27/21 0800)                 Keishawna Carranza L Terre Hanneman

## 2021-03-27 NOTE — Progress Notes (Signed)
POSTPARTUM PROGRESS NOTE  POD #1 , PPD#2  Subjective:  Suzanne Lambert is a 35 y.o. G2P0010 s/p SVD at [redacted]w[redacted]d- demise and D&C due to retained placenta. Today she notes that she is doing ok- having difficulty sleeping and feels like some of that is due to the loss. She denies any problems with ambulating, voiding or po intake. Denies nausea or vomiting. She has passed flatus, no BM.  Pain is moderate- improved with medication.  Lochia minimal Denies fever/chills/chest pain/SOB.  no HA, no blurry vision, noRUQ pain  Objective: Blood pressure (!) 109/58, pulse 83, temperature 97.9 F (36.6 C), temperature source Oral, resp. rate 16, last menstrual period 10/23/2020, SpO2 100 %.  Physical Exam:  General: alert, cooperative and no distress Chest: no respiratory distress, CTAB Heart: regular rate and rhythm Abdomen: soft, nontender Uterine Fundus: firm, appropriately tender DVT Evaluation: No calf swelling or tenderness Extremities: no edema Skin: warm, dry  Results for orders placed or performed during the hospital encounter of 03/25/21 (from the past 24 hour(s))  CBC     Status: Abnormal   Collection Time: 03/26/21 11:36 AM  Result Value Ref Range   WBC 21.7 (H) 4.0 - 10.5 K/uL   RBC 2.97 (L) 3.87 - 5.11 MIL/uL   Hemoglobin 8.4 (L) 12.0 - 15.0 g/dL   HCT 16.1 (L) 09.6 - 04.5 %   MCV 82.5 80.0 - 100.0 fL   MCH 28.3 26.0 - 34.0 pg   MCHC 34.3 30.0 - 36.0 g/dL   RDW 40.9 (H) 81.1 - 91.4 %   Platelets 302 150 - 400 K/uL   nRBC 0.0 0.0 - 0.2 %  Comprehensive metabolic panel     Status: Abnormal   Collection Time: 03/26/21 11:36 AM  Result Value Ref Range   Sodium 131 (L) 135 - 145 mmol/L   Potassium 3.8 3.5 - 5.1 mmol/L   Chloride 101 98 - 111 mmol/L   CO2 18 (L) 22 - 32 mmol/L   Glucose, Bld 215 (H) 70 - 99 mg/dL   BUN 5 (L) 6 - 20 mg/dL   Creatinine, Ser 7.82 0.44 - 1.00 mg/dL   Calcium 7.8 (L) 8.9 - 10.3 mg/dL   Total Protein 5.6 (L) 6.5 - 8.1 g/dL   Albumin 2.6 (L) 3.5 -  5.0 g/dL   AST 16 15 - 41 U/L   ALT 10 0 - 44 U/L   Alkaline Phosphatase 46 38 - 126 U/L   Total Bilirubin 0.4 0.3 - 1.2 mg/dL   GFR, Estimated >95 >62 mL/min   Anion gap 12 5 - 15    Assessment/Plan: Suzanne Lambert is a 35 y.o. G2P0010 s/p D&C POD #1, SVD at [redacted]w[redacted]d PPD#2 complicated by: 1) Hemorrhage -Bleeding stable, CBC as above, currently asymptomatic -iron every other day  2) Superimposed preeclampsia with severe features -s/p Mag x 24hr -typically requires procardia; BP has been low here- likely due to hemorrhage- plan to continue to monitor  3) Fetal loss -encouraged pt to seek counseling, plan for transition of care team today -pt noting difficulty sleeping- reassured pt this can be expected with recent loss, will plan to follow closely as outpatient regarding grief management   LOS: 2 days   Myna Hidalgo, DO Faculty Attending, Center for [facility] 03/27/2021, 7:14 AM

## 2021-03-27 NOTE — Progress Notes (Signed)
Patient and family have chosen [facility] in [place].

## 2021-03-28 ENCOUNTER — Encounter (HOSPITAL_COMMUNITY): Payer: Self-pay | Admitting: *Deleted

## 2021-03-28 DIAGNOSIS — Z3480 Encounter for supervision of other normal pregnancy, unspecified trimester: Secondary | ICD-10-CM

## 2021-03-28 MED ORDER — IBUPROFEN 600 MG PO TABS: 600.0000 mg | ORAL_TABLET | Freq: Four times a day (QID) | ORAL | 0 refills | Status: AC

## 2021-03-28 MED ORDER — NIFEDIPINE ER 30 MG PO TB24: 30.0000 mg | ORAL_TABLET | Freq: Every day | ORAL | 0 refills | Status: DC

## 2021-03-28 MED ORDER — OXYCODONE HCL 5 MG PO TABS: 5.0000 mg | ORAL_TABLET | ORAL | 0 refills | Status: AC | PRN

## 2021-03-28 NOTE — Progress Notes (Signed)
CSW received consult due to IUFD.  CSW available for support secondary to [facility] and will await call from Chaplain before becoming involved.  CSW screening out referral at this time.   Chaplain aware and plans to follow up with patient.   Celso Sickle, LCSW Clinical Social Worker [facility] Cell#: [phone]

## 2021-03-28 NOTE — Discharge Summary (Signed)
Postpartum Discharge Summary  Date of Service updated 03/28/2021      Patient Name: Suzanne Lambert DOB: 1986/05/05 MRN: [id number]  Date of admission: 03/25/2021 Delivery date:03/25/2021  Delivering provider:   Date of discharge: 03/28/2021  Admitting diagnosis: Supervision of other normal pregnancy, antepartum [Z34.80] Intrauterine pregnancy: [redacted]w[redacted]d preterm delivery at home with neonatal demise Secondary diagnosis:  Active Problems:   Supervision of other normal pregnancy, antepartum   AMA (advanced maternal age) multigravida 35+  Additional problems: Superimposed preeclampsia severe features    Discharge diagnosis: Preterm Pregnancy Delivered and CHTN                                              Post partum procedures: D and C  Augmentation: Pitocin and Cytotec Complications: None  Hospital course: Onset of Labor With Vaginal Delivery      35y.o. yo G2P0010 at 252w2das admitted after delivery at home and was brought by EMS on 03/25/2021. She had retained placenta and required D and C. Infant expired in the NICU due to severe prematurity. Membrane Rupture Time/Date:  ,03/15/2021   Delivery Method:Vaginal, Spontaneous  Episiotomy: None  Lacerations:  None  She received magnesium for seizure prophylaxis.  Blood pressure was well controlled. She is ambulating, tolerating a regular diet, passing flatus, and urinating well. Patient is discharged home in stable condition on 03/28/21.  Newborn Data: Birth date:03/25/2021  Birth time:9:31 PM  Gender:Female  Living status:  Apgars: ,  Weight:454 g   Magnesium Sulfate received: Yes: Seizure prophylaxis BMZ received: No Rhophylac:No MMR:No T-DaP:Given prenatally Flu: No Transfusion:No  Physical exam  Vitals:   03/27/21 1533 03/27/21 1957 03/27/21 2259 03/28/21 0454  BP: (!) 117/56 (!) 120/58 (!) 110/51 (!) 114/58  Pulse: 72 72 60 60  Resp: 16 17 15 15   Temp: 98.3 F (36.8 C) 98.1 F (36.7 C) 98.5 F (36.9 C) 98.5 F  (36.9 C)  TempSrc: Oral Oral Oral Oral  SpO2: 100% 100% 100% 100%   General: alert, cooperative, and no distress Lochia: appropriate Uterine Fundus: firm Incision: N/A DVT Evaluation: No evidence of DVT seen on physical exam. Labs: Lab Results  Component Value Date   WBC 21.7 (H) 03/26/2021   HGB 8.4 (L) 03/26/2021   HCT 24.5 (L) 03/26/2021   MCV 82.5 03/26/2021   PLT 302 03/26/2021   CMP Latest Ref Rng & Units 03/26/2021  Glucose 70 - 99 mg/dL 215(H)  BUN 6 - 20 mg/dL 5(L)  Creatinine 0.44 - 1.00 mg/dL 0.62  Sodium 135 - 145 mmol/L 131(L)  Potassium 3.5 - 5.1 mmol/L 3.8  Chloride 98 - 111 mmol/L 101  CO2 22 - 32 mmol/L 18(L)  Calcium 8.9 - 10.3 mg/dL 7.8(L)  Total Protein 6.5 - 8.1 g/dL 5.6(L)  Total Bilirubin 0.3 - 1.2 mg/dL 0.4  Alkaline Phos 38 - 126 U/L 46  AST 15 - 41 U/L 16  ALT 0 - 44 U/L 10   Edinburgh Score: No flowsheet data found.   After visit meds:  Allergies as of 03/28/2021   No Known Allergies      Medication List     STOP taking these medications    acetaminophen 325 MG tablet Commonly known as: TYLENOL   aspirin 81 MG chewable tablet   ferrous fumarate 325 (106 Fe) MG Tabs tablet Commonly known as: HEMOCYTE -  106 mg FE   multivitamin-prenatal 27-0.8 MG Tabs tablet       TAKE these medications    ibuprofen 600 MG tablet Commonly known as: ADVIL Take 1 tablet (600 mg total) by mouth every 6 (six) hours.   NIFEdipine 30 MG 24 hr tablet Commonly known as: ADALAT CC Take 1 tablet (30 mg total) by mouth daily. What changed:  medication strength how much to take   oxyCODONE 5 MG immediate release tablet Commonly known as: Oxy IR/ROXICODONE Take 1 tablet (5 mg total) by mouth every 4 (four) hours as needed (pain scale 4-7).         Discharge home in stable condition Infant [place] Discharge instruction: per After Visit Summary and Postpartum booklet. Activity: Advance as tolerated. Pelvic rest for 6 weeks.   Diet: routine diet Future Appointments: Future Appointments  Date Time Provider [place]  03/29/2021  9:35 AM Clarnce Flock, MD [facility]  04/04/2021 10:30 AM [facility] NURSE [facility]  04/04/2021 10:45 AM [facility] US4 [facility] [place]   Follow up Visit:  [place] [facility] Follow up in 1 week(s).   Specialty: Obstetrics and Gynecology Why: BP Contact information: [place] [id number] [phone]                 Please schedule this patient for a In person postpartum visit in 1 week with the following provider: Any provider. Additional Postpartum F/U:Postpartum Depression checkup and BP check 1 week  High risk pregnancy complicated by: HTN Delivery mode:  Vaginal, Spontaneous  Anticipated Birth Control:  Unsure   03/28/2021 Emeterio Reeve, MD

## 2021-03-28 NOTE — Plan of Care (Signed)
  Problem: Education: Goal: Knowledge of General Education information will improve Description: Including pain rating scale, medication(s)/side effects and non-pharmacologic comfort measures Outcome: Adequate for Discharge   Problem: Health Behavior/Discharge Planning: Goal: Ability to manage health-related needs will improve Outcome: Adequate for Discharge   Problem: Clinical Measurements: Goal: Ability to maintain clinical measurements within normal limits will improve Outcome: Adequate for Discharge Goal: Will remain free from infection Outcome: Adequate for Discharge Goal: Diagnostic test results will improve Outcome: Adequate for Discharge Goal: Respiratory complications will improve Outcome: Adequate for Discharge Goal: Cardiovascular complication will be avoided Outcome: Adequate for Discharge   Problem: Activity: Goal: Risk for activity intolerance will decrease Outcome: Adequate for Discharge   Problem: Nutrition: Goal: Adequate nutrition will be maintained Outcome: Adequate for Discharge   Problem: Coping: Goal: Level of anxiety will decrease Outcome: Adequate for Discharge   Problem: Elimination: Goal: Will not experience complications related to bowel motility Outcome: Adequate for Discharge Goal: Will not experience complications related to urinary retention Outcome: Adequate for Discharge   Problem: Pain Managment: Goal: General experience of comfort will improve Outcome: Adequate for Discharge   Problem: Safety: Goal: Ability to remain free from injury will improve Outcome: Adequate for Discharge   Problem: Skin Integrity: Goal: Risk for impaired skin integrity will decrease Outcome: Adequate for Discharge   Problem: Education: Goal: Knowledge of disease or condition will improve Outcome: Adequate for Discharge Goal: Knowledge of the prescribed therapeutic regimen will improve Outcome: Adequate for Discharge   Problem: Fluid Volume: Goal:  Peripheral tissue perfusion will improve Outcome: Adequate for Discharge   Problem: Clinical Measurements: Goal: Complications related to disease process, condition or treatment will be avoided or minimized Outcome: Adequate for Discharge   Problem: Education: Goal: Knowledge of condition will improve Outcome: Adequate for Discharge Goal: Individualized Educational Video(s) Outcome: Adequate for Discharge   Problem: Activity: Goal: Will verbalize the importance of balancing activity with adequate rest periods Outcome: Adequate for Discharge Goal: Ability to tolerate increased activity will improve Outcome: Adequate for Discharge   Problem: Coping: Goal: Ability to identify and utilize available resources and services will improve Outcome: Adequate for Discharge   Problem: Life Cycle: Goal: Chance of risk for complications during the postpartum period will decrease Outcome: Adequate for Discharge   Problem: Skin Integrity: Goal: Demonstration of wound healing without infection will improve Outcome: Adequate for Discharge   Problem: Education: Goal: Knowledge of disease or condition will improve Outcome: Adequate for Discharge Goal: Knowledge of the prescribed therapeutic regimen will improve Outcome: Adequate for Discharge   Problem: Coping: Goal: Ability to identify and utilize appropriate coping strategies will improve Outcome: Adequate for Discharge Goal: Ability to identify and utilize available support systems will improve Outcome: Adequate for Discharge Goal: Will verbalize feelings Outcome: Adequate for Discharge Goal: Decrease level of anxiety will Outcome: Adequate for Discharge   Problem: Clinical Measurements: Goal: Will show no signs and symptoms of excessive bleeding Outcome: Adequate for Discharge

## 2021-03-29 ENCOUNTER — Encounter: Payer: Medicaid Other | Admitting: Family Medicine

## 2021-03-29 LAB — SURGICAL PATHOLOGY

## 2021-04-04 ENCOUNTER — Ambulatory Visit: Payer: Medicaid Other

## 2021-04-06 ENCOUNTER — Telehealth (HOSPITAL_COMMUNITY): Payer: Self-pay | Admitting: *Deleted

## 2021-04-06 NOTE — Telephone Encounter (Signed)
Hospital Discharge Follow-Up Call:  Patient reports that she is healing well physically and has no concerns about her healing process.  She says that emotionally things are "terrible" but that she has very good support from family and friends and that they are "getting through" this stage of grief.  She accepted offer of information about Bereavement Support Group.

## 2021-04-18 ENCOUNTER — Other Ambulatory Visit: Payer: Self-pay

## 2021-04-25 ENCOUNTER — Other Ambulatory Visit (INDEPENDENT_AMBULATORY_CARE_PROVIDER_SITE_OTHER): Payer: Self-pay

## 2021-10-13 ENCOUNTER — Emergency Department (HOSPITAL_COMMUNITY): Payer: Medicaid Other

## 2021-10-13 ENCOUNTER — Emergency Department (HOSPITAL_COMMUNITY)
Admission: EM | Admit: 2021-10-13 | Discharge: 2021-10-13 | Disposition: A | Discharge status: Home / Self Care | Payer: Medicaid Other | Attending: Emergency Medicine | Admitting: Emergency Medicine

## 2021-10-13 ENCOUNTER — Encounter (HOSPITAL_COMMUNITY): Payer: Self-pay

## 2021-10-13 DIAGNOSIS — I10 Essential (primary) hypertension: Secondary | ICD-10-CM | POA: Diagnosis not present

## 2021-10-13 DIAGNOSIS — S4991XA Unspecified injury of right shoulder and upper arm, initial encounter: Secondary | ICD-10-CM | POA: Diagnosis present

## 2021-10-13 DIAGNOSIS — Y92410 Unspecified street and highway as the place of occurrence of the external cause: Secondary | ICD-10-CM | POA: Insufficient documentation

## 2021-10-13 DIAGNOSIS — S0990XA Unspecified injury of head, initial encounter: Secondary | ICD-10-CM | POA: Diagnosis not present

## 2021-10-13 DIAGNOSIS — S29001A Unspecified injury of muscle and tendon of front wall of thorax, initial encounter: Secondary | ICD-10-CM | POA: Diagnosis not present

## 2021-10-13 LAB — I-STAT CHEM 8, ED
BUN: 7 mg/dL (ref 6–20)
Calcium, Ion: 1.18 mmol/L (ref 1.15–1.40)
Chloride: 104 mmol/L (ref 98–111)
Creatinine, Ser: 0.5 mg/dL (ref 0.44–1.00)
Glucose, Bld: 107 mg/dL — ABNORMAL HIGH (ref 70–99)
HCT: 40 % (ref 36.0–46.0)
Hemoglobin: 13.6 g/dL (ref 12.0–15.0)
Potassium: 3.4 mmol/L — ABNORMAL LOW (ref 3.5–5.1)
Sodium: 139 mmol/L (ref 135–145)
TCO2: 25 mmol/L (ref 22–32)

## 2021-10-13 LAB — I-STAT BETA HCG BLOOD, ED (MC, WL, AP ONLY): I-stat hCG, quantitative: <5 m[IU]/mL (ref <5)

## 2021-10-13 MED ORDER — METHOCARBAMOL 500 MG PO TABS: 500.0000 mg | ORAL_TABLET | Freq: Two times a day (BID) | ORAL | 0 refills | Status: AC

## 2021-10-13 MED ORDER — NIFEDIPINE ER 30 MG PO TB24: 30.0000 mg | ORAL_TABLET | Freq: Every day | ORAL | 1 refills | Status: AC

## 2021-10-13 MED ORDER — IOHEXOL 300 MG/ML  SOLN
100.0000 mL | Freq: Once | INTRAMUSCULAR | Status: AC | PRN
  Administered 2021-10-13: 100 mL via INTRAVENOUS

## 2021-10-13 NOTE — ED Notes (Signed)
RN reviewed discharge instructions w/ pt. Follow up, prescriptions and pain management reviewed. Pt had no further questions. °
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
## 2021-10-13 NOTE — ED Notes (Signed)
Pt ambulated independently to bathroom.
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
## 2024-01-21 ENCOUNTER — Encounter (HOSPITAL_COMMUNITY): Payer: Self-pay

## 2024-01-21 ENCOUNTER — Emergency Department (HOSPITAL_COMMUNITY)
Admission: EM | Admit: 2024-01-21 | Discharge: 2024-01-21 | Disposition: A | Discharge status: Home / Self Care | Source: Ambulatory Visit

## 2024-01-21 DIAGNOSIS — I10 Essential (primary) hypertension: Secondary | ICD-10-CM | POA: Diagnosis present

## 2024-01-21 DIAGNOSIS — Z79899 Other long term (current) drug therapy: Secondary | ICD-10-CM | POA: Diagnosis not present

## 2024-01-21 LAB — CBC WITH DIFFERENTIAL/PLATELET
Abs Immature Granulocytes: 0.04 K/uL (ref 0.00–0.07)
Basophils Absolute: 0.1 K/uL (ref 0.0–0.1)
Basophils Relative: 1 %
Eosinophils Absolute: 0.1 K/uL (ref 0.0–0.5)
Eosinophils Relative: 1 %
HCT: 35.4 % — ABNORMAL LOW (ref 36.0–46.0)
Hemoglobin: 11.7 g/dL — ABNORMAL LOW (ref 12.0–15.0)
Immature Granulocytes: 0 %
Lymphocytes Relative: 47 %
Lymphs Abs: 4.8 K/uL — ABNORMAL HIGH (ref 0.7–4.0)
MCH: 26.5 pg (ref 26.0–34.0)
MCHC: 33.1 g/dL (ref 30.0–36.0)
MCV: 80.3 fL (ref 80.0–100.0)
Monocytes Absolute: 1.1 K/uL — ABNORMAL HIGH (ref 0.1–1.0)
Monocytes Relative: 10 %
Neutro Abs: 4.2 K/uL (ref 1.7–7.7)
Neutrophils Relative %: 41 %
Platelets: 336 K/uL (ref 150–400)
RBC: 4.41 MIL/uL (ref 3.87–5.11)
RDW: 15 % (ref 11.5–15.5)
WBC: 10.4 K/uL (ref 4.0–10.5)
nRBC: 0 % (ref 0.0–0.2)

## 2024-01-21 LAB — COMPREHENSIVE METABOLIC PANEL WITH GFR
ALT: 20 U/L (ref 0–44)
AST: 24 U/L (ref 15–41)
Albumin: 3.9 g/dL (ref 3.5–5.0)
Alkaline Phosphatase: 65 U/L (ref 38–126)
Anion gap: 11 (ref 5–15)
BUN: 13 mg/dL (ref 6–20)
CO2: 22 mmol/L (ref 22–32)
Calcium: 9.2 mg/dL (ref 8.9–10.3)
Chloride: 106 mmol/L (ref 98–111)
Creatinine, Ser: 0.68 mg/dL (ref 0.44–1.00)
GFR, Estimated: >60 mL/min (ref >60)
Glucose, Bld: 96 mg/dL (ref 70–99)
Potassium: 3.6 mmol/L (ref 3.5–5.1)
Sodium: 139 mmol/L (ref 135–145)
Total Bilirubin: 0.6 mg/dL (ref 0.0–1.2)
Total Protein: 7.5 g/dL (ref 6.5–8.1)

## 2024-01-21 MED ORDER — HYDROCODONE-ACETAMINOPHEN 5-325 MG PO TABS
1.0000 | ORAL_TABLET | Freq: Once | ORAL | Status: AC
  Administered 2024-01-21: 1 via ORAL
  Filled 2024-01-21: qty 1

## 2024-01-21 MED ORDER — HYDROCHLOROTHIAZIDE 25 MG PO TABS
25.0000 mg | ORAL_TABLET | Freq: Once | ORAL | Status: AC
  Administered 2024-01-21: 25 mg via ORAL
  Filled 2024-01-21: qty 1

## 2024-01-21 NOTE — ED Notes (Addendum)
 Patient c/o abdominal pain and a headache at this time. Provider [facility] notified.

## 2024-01-21 NOTE — Discharge Instructions (Signed)
 These take your blood pressures to 3 times daily.  Take your blood pressure and write it down.  Take this sheet to your primary care physician so that we can adjust the medication in a safe manner.  Please take your medication as prescribed.   You have any, chest pain or shortness of breath or any kind of numbness or tingling anywhere then please come back to ED for further evaluation.
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
## 2024-01-21 NOTE — ED Provider Triage Note (Signed)
 Emergency Medicine Provider Triage Evaluation Note  Suzanne Lambert , a 38 y.o. female  was evaluated in triage.  Pt complains of HTN.  Review of Systems  Positive: HTN, meds noncompliance Negative: CP, SOB, HA  Physical Exam  BP (!) 204/150 (BP Location: Left Arm)   Pulse 72   Temp 99.7 F (37.6 C) (Oral)   Resp 16   SpO2 100%  Gen:   Awake, no distress   Resp:  Normal effort  MSK:   Moves extremities without difficulty  Other:    Medical Decision Making  Medically screening exam initiated at 3:25 PM.  Appropriate orders placed.  Suzanne Lambert was informed that the remainder of the evaluation will be completed by another provider, this initial triage assessment does not replace that evaluation, and the importance of remaining in the ED until their evaluation is complete.  Patient w/history HTN, on Labetalol  for the past 6 months (100 mg). PCP added hydrochlorothiazide  25 mg in mid-June for persistent HTN. She denies any current symptoms. Sent by PCP when her BP was found to be 212/140 at the office. She states she does not take her medications daily - maybe 4 times a week.    Odell Balls, PA-C 01/21/24 1527

## 2024-01-21 NOTE — ED Triage Notes (Signed)
 Pt c/o elevated BP x a while.  Pt reports she has not been compliant w/ medications.
